# Patient Record
Sex: Female | Born: 1955 | Race: White | Hispanic: No | Marital: Married | Smoking: Never smoker
Health system: Southern US, Community
[De-identification: ages and names within clinical notes are randomized; demographics above are authoritative.]

## PROBLEM LIST (undated history)

## (undated) DIAGNOSIS — E039 Hypothyroidism, unspecified: Secondary | ICD-10-CM

## (undated) HISTORY — PX: BREAST BIOPSY: SHX20

## (undated) HISTORY — PX: CHOLECYSTECTOMY: SHX55

---

## 1999-11-01 ENCOUNTER — Other Ambulatory Visit: Admission: RE | Admit: 1999-11-01 | Discharge: 1999-11-01 | Payer: Self-pay | Admitting: Obstetrics and Gynecology

## 1999-11-02 ENCOUNTER — Encounter: Admission: RE | Admit: 1999-11-02 | Discharge: 1999-11-02 | Payer: Self-pay | Admitting: Obstetrics and Gynecology

## 1999-11-02 ENCOUNTER — Encounter: Payer: Self-pay | Admitting: Obstetrics and Gynecology

## 2000-02-14 ENCOUNTER — Encounter: Payer: Self-pay | Admitting: Neurology

## 2000-02-14 ENCOUNTER — Encounter: Admission: RE | Admit: 2000-02-14 | Discharge: 2000-02-14 | Payer: Self-pay | Admitting: Neurology

## 2001-08-08 ENCOUNTER — Encounter: Payer: Self-pay | Admitting: Obstetrics and Gynecology

## 2001-08-08 ENCOUNTER — Encounter: Admission: RE | Admit: 2001-08-08 | Discharge: 2001-08-08 | Payer: Self-pay | Admitting: Obstetrics and Gynecology

## 2001-08-14 ENCOUNTER — Other Ambulatory Visit: Admission: RE | Admit: 2001-08-14 | Discharge: 2001-08-14 | Payer: Self-pay | Admitting: Obstetrics and Gynecology

## 2003-01-16 ENCOUNTER — Encounter: Payer: Self-pay | Admitting: Obstetrics and Gynecology

## 2003-01-16 ENCOUNTER — Encounter: Admission: RE | Admit: 2003-01-16 | Discharge: 2003-01-16 | Payer: Self-pay | Admitting: Obstetrics and Gynecology

## 2003-02-10 ENCOUNTER — Other Ambulatory Visit: Admission: RE | Admit: 2003-02-10 | Discharge: 2003-02-10 | Payer: Self-pay | Admitting: Obstetrics and Gynecology

## 2003-02-24 ENCOUNTER — Ambulatory Visit (HOSPITAL_COMMUNITY): Admission: RE | Admit: 2003-02-24 | Discharge: 2003-02-24 | Payer: Self-pay | Admitting: Neurology

## 2003-02-24 ENCOUNTER — Encounter: Payer: Self-pay | Admitting: Neurology

## 2003-03-06 ENCOUNTER — Encounter: Admission: RE | Admit: 2003-03-06 | Discharge: 2003-06-04 | Payer: Self-pay | Admitting: Neurology

## 2004-08-10 ENCOUNTER — Encounter: Admission: RE | Admit: 2004-08-10 | Discharge: 2004-08-10 | Payer: Self-pay | Admitting: Obstetrics and Gynecology

## 2004-09-08 ENCOUNTER — Encounter: Admission: RE | Admit: 2004-09-08 | Discharge: 2004-09-08 | Payer: Self-pay | Admitting: Obstetrics and Gynecology

## 2004-09-20 ENCOUNTER — Encounter (HOSPITAL_COMMUNITY): Admission: RE | Admit: 2004-09-20 | Discharge: 2004-12-19 | Payer: Self-pay | Admitting: Geriatric Medicine

## 2004-09-23 ENCOUNTER — Encounter (INDEPENDENT_AMBULATORY_CARE_PROVIDER_SITE_OTHER): Payer: Self-pay | Admitting: *Deleted

## 2004-09-23 ENCOUNTER — Ambulatory Visit (HOSPITAL_COMMUNITY): Admission: RE | Admit: 2004-09-23 | Discharge: 2004-09-23 | Payer: Self-pay | Admitting: Geriatric Medicine

## 2005-04-27 ENCOUNTER — Encounter: Admission: RE | Admit: 2005-04-27 | Discharge: 2005-04-27 | Payer: Self-pay | Admitting: Geriatric Medicine

## 2005-07-07 ENCOUNTER — Ambulatory Visit (HOSPITAL_COMMUNITY): Admission: RE | Admit: 2005-07-07 | Discharge: 2005-07-07 | Payer: Self-pay | Admitting: Gastroenterology

## 2005-10-12 ENCOUNTER — Encounter: Admission: RE | Admit: 2005-10-12 | Discharge: 2005-10-12 | Payer: Self-pay | Admitting: Obstetrics and Gynecology

## 2006-01-23 ENCOUNTER — Encounter: Admission: RE | Admit: 2006-01-23 | Discharge: 2006-01-23 | Payer: Self-pay | Admitting: Geriatric Medicine

## 2006-03-07 IMAGING — US US SOFT TISSUE HEAD/NECK
1 series · 14 of 25 positions shown · non-contrast
Comparison: none

CLINICAL DATA: 48-year-old female, with enlarged thyroid. 
THYROID ULTRASOUND:

[Series 1: unknown · 0.07mm/px · 14 of 58 slices shown]
[im 1/58]
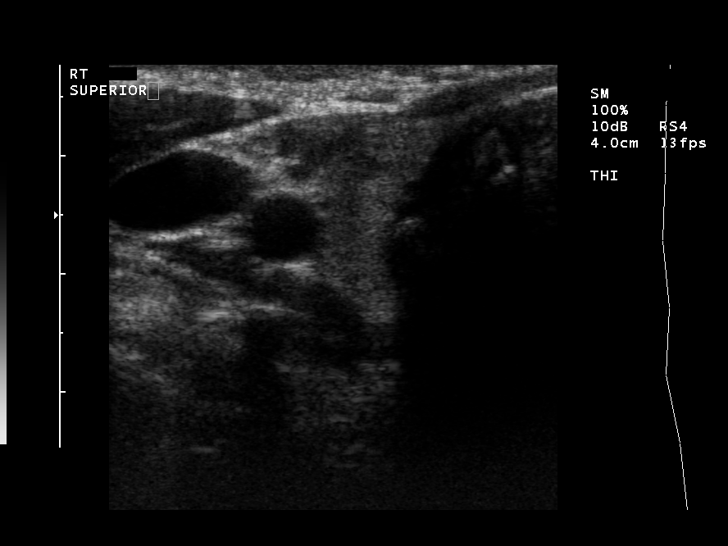
[im 5/58]
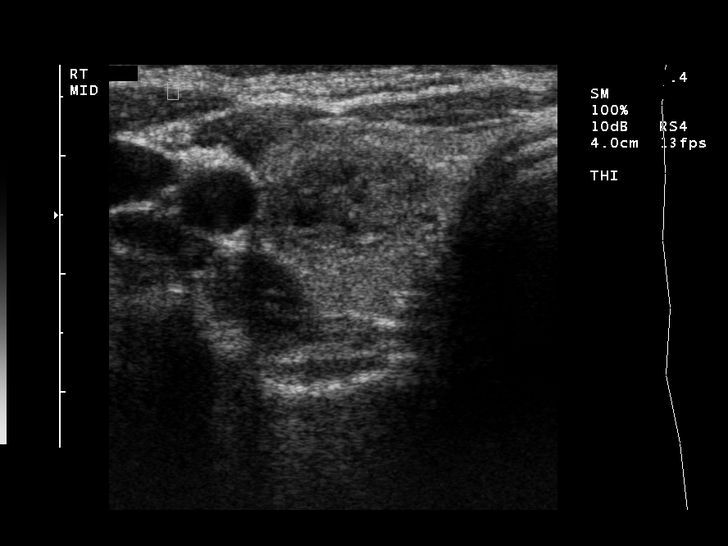
[im 10/58]
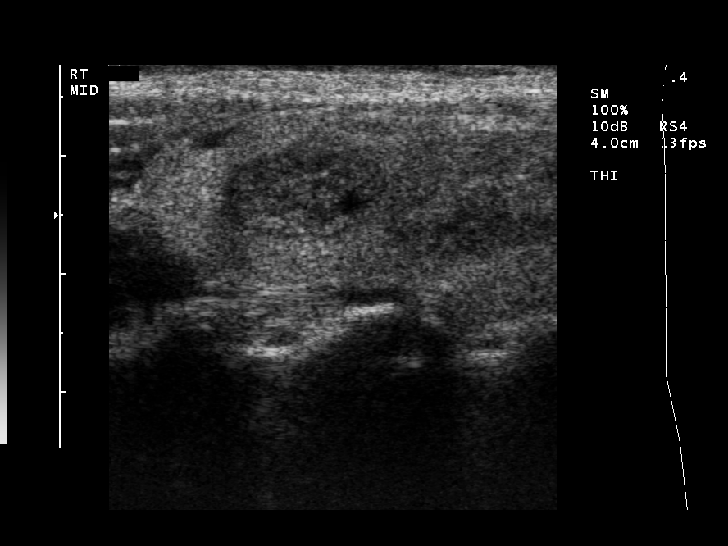
[im 15/58]
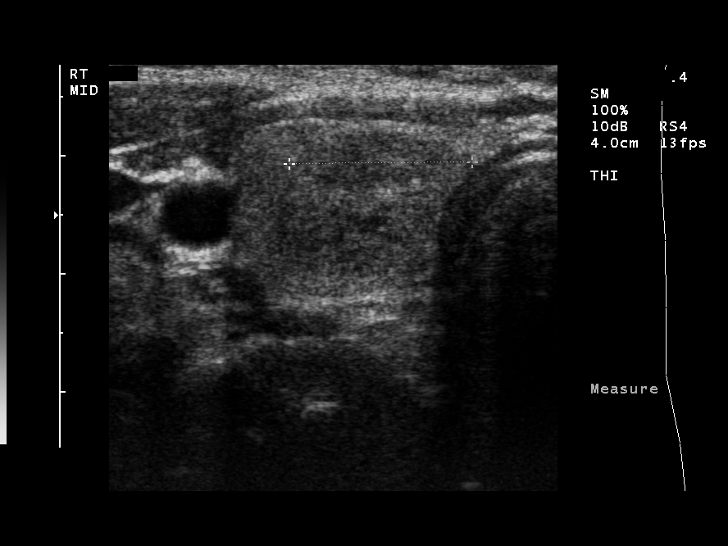
[im 20/58]
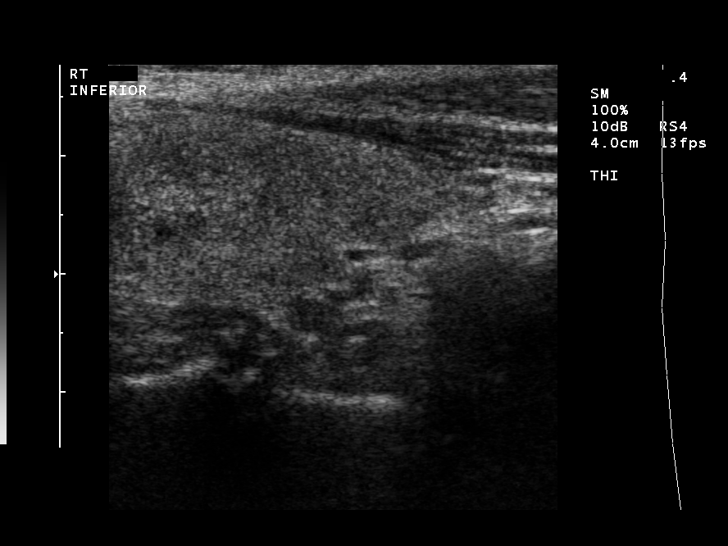
[im 22/58]
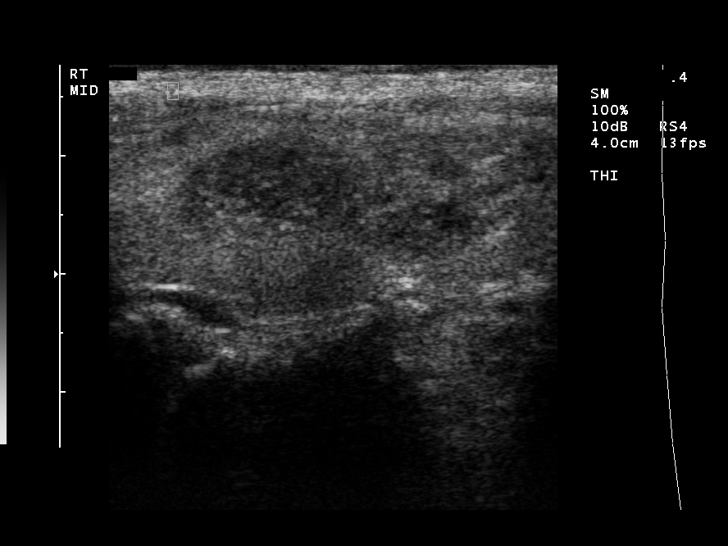
[im 27/58]
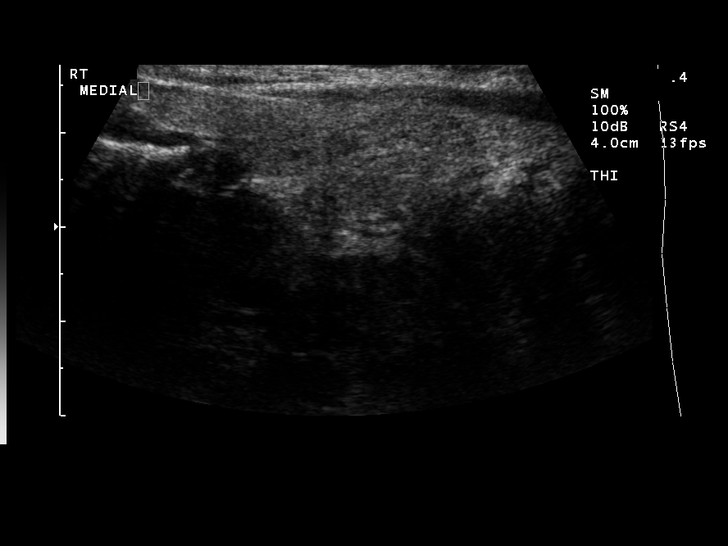
[im 31/58]
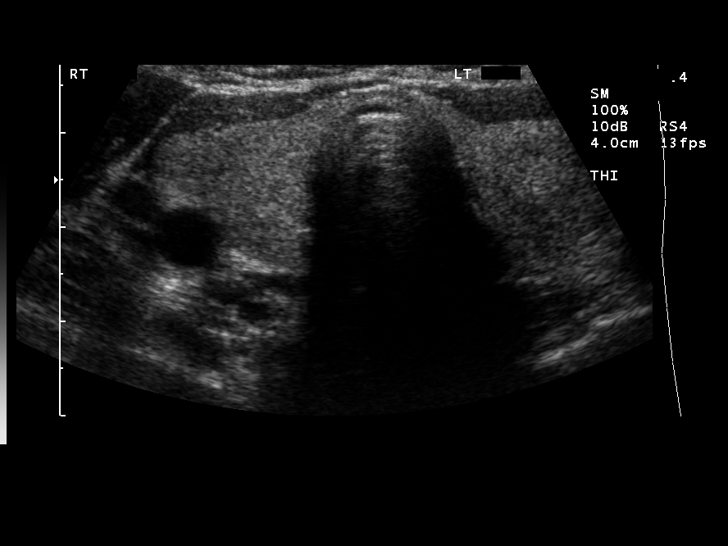
[im 36/58]
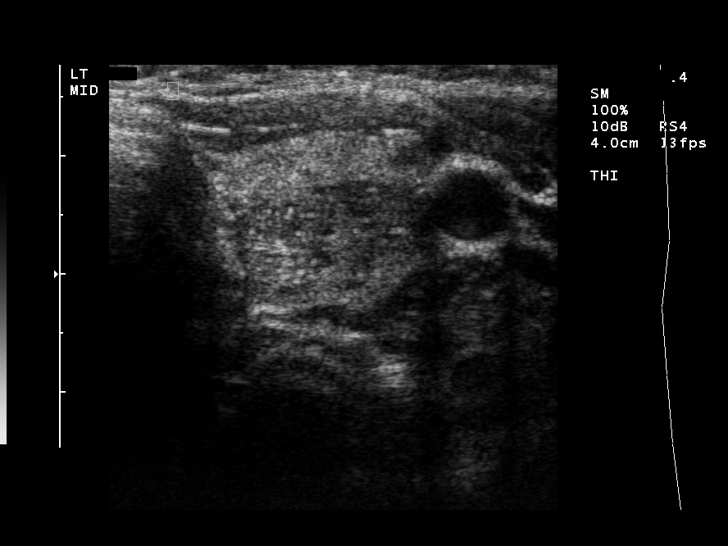
[im 39/58]
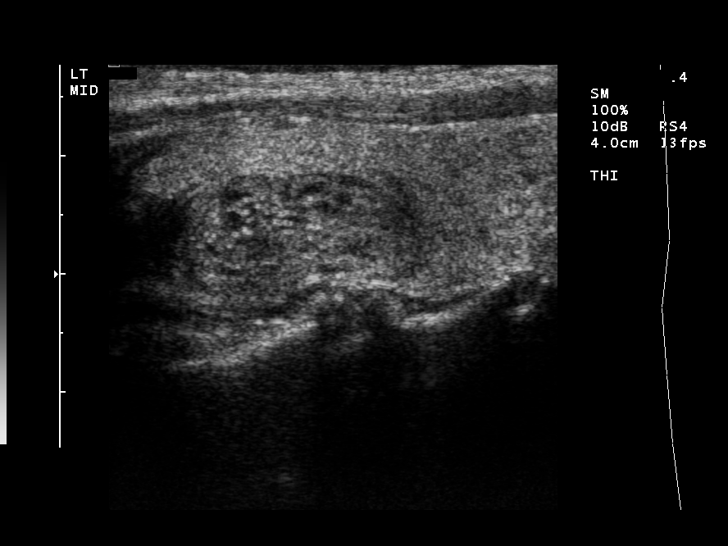
[im 43/58]
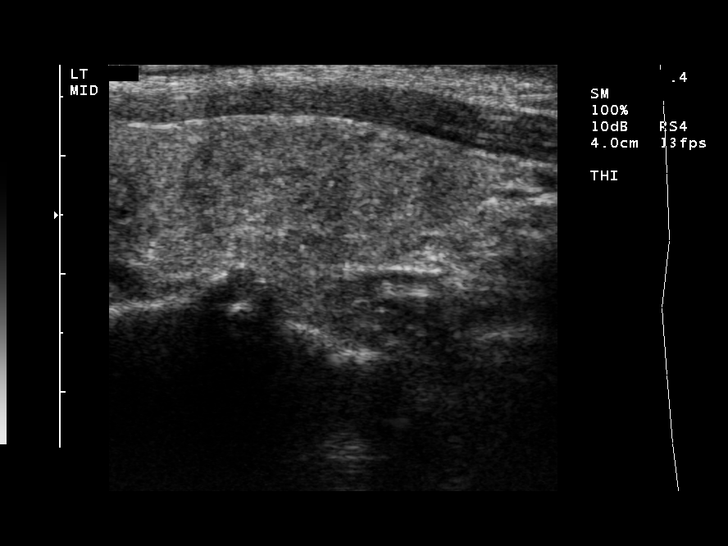
[im 48/58]
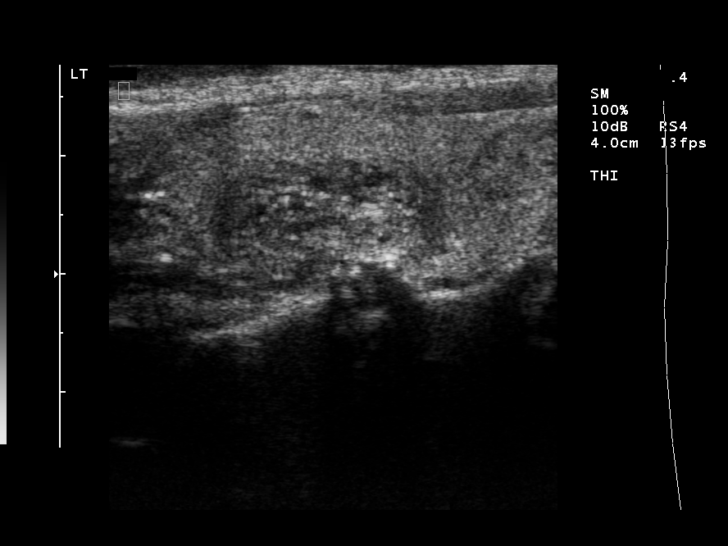
[im 53/58]
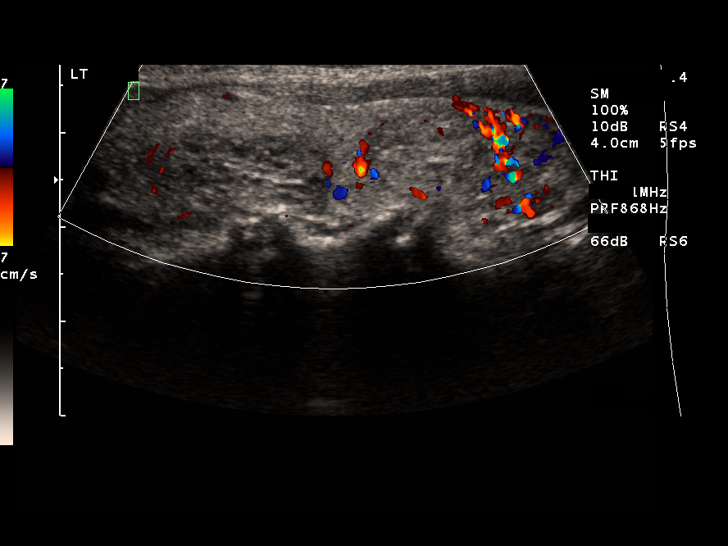
[im 58/58]
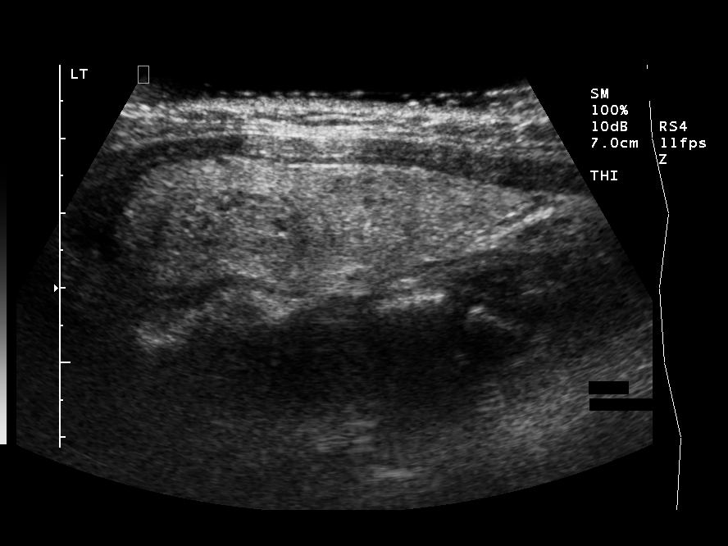

[14 of 25 positions shown; findings below may reference images not displayed]

FINDINGS: Thyroid echotexture is mildly heterogeneous with the right lobe measuring 5.7 x 1.5 x 1.8 cm and the left lobe measuring 5.8 x 1.6 x 1.9 cm.  There are several focal nodules identified.  These include a 1.3 x 1.3 cm nodule in the mid right thyroid, a 1.9 x 1.5 cm nodule in the right lower thyroid, a 2.1 x 1.6 cm nodule in the left midthyroid, and a 2.5 x 1.6 cm nodule in the lower left thyroid.  A few smaller nodules scattered within the thyroid gland are noted, less than 5 mm.  No definite calcifications or cystic areas associated with these nodules.  No other focal masses identified.
IMPRESSION: 1.  Scattered bilateral thyroid nodules.  Four are over 1.5 cm and consider interval followup, nuclear medicine study, or sampling. 
2.  Mildly heterogeneous thyroid echotexture.

## 2006-10-13 ENCOUNTER — Encounter: Admission: RE | Admit: 2006-10-13 | Discharge: 2006-10-13 | Payer: Self-pay | Admitting: Obstetrics and Gynecology

## 2007-10-17 ENCOUNTER — Encounter: Admission: RE | Admit: 2007-10-17 | Discharge: 2007-10-17 | Payer: Self-pay | Admitting: Obstetrics and Gynecology

## 2007-10-22 ENCOUNTER — Encounter: Admission: RE | Admit: 2007-10-22 | Discharge: 2007-10-22 | Payer: Self-pay | Admitting: Obstetrics and Gynecology

## 2007-11-16 ENCOUNTER — Encounter: Admission: RE | Admit: 2007-11-16 | Discharge: 2007-11-16 | Payer: Self-pay | Admitting: Obstetrics and Gynecology

## 2007-11-19 HISTORY — PX: BREAST BIOPSY: SHX20

## 2008-06-23 ENCOUNTER — Encounter: Admission: RE | Admit: 2008-06-23 | Discharge: 2008-06-23 | Payer: Self-pay | Admitting: Geriatric Medicine

## 2008-07-01 ENCOUNTER — Ambulatory Visit (HOSPITAL_COMMUNITY): Admission: RE | Admit: 2008-07-01 | Discharge: 2008-07-01 | Payer: Self-pay | Admitting: *Deleted

## 2008-07-08 ENCOUNTER — Ambulatory Visit (HOSPITAL_COMMUNITY): Admission: RE | Admit: 2008-07-08 | Discharge: 2008-07-08 | Payer: Self-pay | Admitting: Gastroenterology

## 2008-07-10 ENCOUNTER — Encounter (INDEPENDENT_AMBULATORY_CARE_PROVIDER_SITE_OTHER): Payer: Self-pay | Admitting: Gastroenterology

## 2008-07-10 ENCOUNTER — Ambulatory Visit (HOSPITAL_COMMUNITY): Admission: RE | Admit: 2008-07-10 | Discharge: 2008-07-10 | Payer: Self-pay | Admitting: Gastroenterology

## 2008-08-07 ENCOUNTER — Ambulatory Visit (HOSPITAL_COMMUNITY): Admission: RE | Admit: 2008-08-07 | Discharge: 2008-08-07 | Payer: Self-pay | Admitting: Surgery

## 2008-08-07 ENCOUNTER — Encounter (INDEPENDENT_AMBULATORY_CARE_PROVIDER_SITE_OTHER): Payer: Self-pay | Admitting: Surgery

## 2008-12-08 ENCOUNTER — Encounter: Admission: RE | Admit: 2008-12-08 | Discharge: 2008-12-08 | Payer: Self-pay | Admitting: Obstetrics and Gynecology

## 2009-12-21 ENCOUNTER — Encounter: Admission: RE | Admit: 2009-12-21 | Discharge: 2009-12-21 | Payer: Self-pay | Admitting: Obstetrics and Gynecology

## 2010-08-17 ENCOUNTER — Emergency Department (HOSPITAL_COMMUNITY): Admission: EM | Admit: 2010-08-17 | Discharge: 2010-08-17 | Payer: Self-pay | Admitting: Emergency Medicine

## 2010-09-22 ENCOUNTER — Encounter
Admission: RE | Admit: 2010-09-22 | Discharge: 2010-09-22 | Payer: Self-pay | Source: Home / Self Care | Attending: Geriatric Medicine | Admitting: Geriatric Medicine

## 2010-12-17 ENCOUNTER — Other Ambulatory Visit: Payer: Self-pay | Admitting: Obstetrics and Gynecology

## 2010-12-17 DIAGNOSIS — Z1231 Encounter for screening mammogram for malignant neoplasm of breast: Secondary | ICD-10-CM

## 2011-01-05 ENCOUNTER — Ambulatory Visit
Admission: RE | Admit: 2011-01-05 | Discharge: 2011-01-05 | Disposition: A | Discharge status: Home / Self Care | Payer: 59 | Source: Ambulatory Visit | Attending: Obstetrics and Gynecology | Admitting: Obstetrics and Gynecology

## 2011-01-05 DIAGNOSIS — Z1231 Encounter for screening mammogram for malignant neoplasm of breast: Secondary | ICD-10-CM

## 2011-01-28 ENCOUNTER — Other Ambulatory Visit: Payer: Self-pay | Admitting: Obstetrics and Gynecology

## 2011-02-22 NOTE — Op Note (Signed)
[name], [name]           ACCOUNT NO.:  [id number]   MEDICAL RECORD NO.:  [id number]          PATIENT TYPE:  AMB   LOCATION:  SDS                          FACILITY:  [facility]   PHYSICIAN:  Currie Paris, M.D.DATE OF BIRTH:  [birth year]   DATE OF PROCEDURE:  08/07/2008  DATE OF DISCHARGE:  08/07/2008                               OPERATIVE REPORT   PREOPERATIVE DIAGNOSIS:  Gallbladder polyp.   POSTOPERATIVE DIAGNOSIS:  Gallbladder polyp.   OPERATION:  Laparoscopic cholecystectomy with operative cholangiogram.   SURGEON:  Currie Paris, MD   ASSISTANT:  Almond Lint, MD   ANESTHESIA:  General.   CLINICAL HISTORY:  This is a 55 year old lady who had had some GI  disturbance and workup was done.  As part of the workup, an ultrasound  of the gallbladder was done.  No stones were demonstrated, but what  appeared to be a polyp in the dome of the gallbladder was noted.  It was  over 1 cm.  After discussion with the patient, we elected to proceed to  cholecystectomy.   DESCRIPTION OF PROCEDURE:  The patient was seen in the holding area and  she had no further questions.  We confirmed cholecystectomy as the  planned procedure.   The patient was taken to the operating room and after satisfactory  general endotracheal anesthesia had been obtained, the abdomen was  prepped and draped.  The time-out was done.   I used 0.25% plain Marcaine for each incision.  The umbilical incision  was made, the fascia opened, and the peritoneal cavity entered.  A purse-  string was placed, the Hasson introduced, and the abdomen insufflated to  15.   With the patient placed in reverse Trendelenburg and tilted to the left,  a 10-11 trocar was placed in the epigastrium and two 5 mm laterally, all  under direct vision.   The gallbladder was noted to be robin's egg blue.  There were few  omental adhesions near the triangle of Calot.  No other abnormalities  were noted with the camera on  looking through the abdomen.   The gallbladder was retracted over the liver.  The adhesions were taken  down.  The peritoneum was opened on the triangle of CLO both anteriorly  and posteriorly, and a nice window dissected out.  I was able to get a  segment of cystic duct, a segment of cystic artery, and a window behind  the cystic artery all opened up, so I had a clear identification of the  anatomy.   A single clip was placed on the cystic artery and one of the cystic duct  with the junction of the gallbladder.  The cystic duct was opened and a  Cook catheter introduced and operative angiography was done.  We did 2  runs.  We saw good filling of the duodenum and what appeared to be a  very long cystic duct draping across the common duct.  We got filling of  the hepatic radicals on the second injection.   The catheter was removed and 3 clips placed on the stay side of the  cystic  duct.  It was divided.  Two additional clips were placed on the  cystic artery and it was divided leaving 2 clips on the stay side.  The  gallbladder was then removed from below to above with coagulation  current of the cautery.  A small vessel was clipped about a third of the  way up the gallbladder bed.   I took care to not spill bile and we were able to keep the gallbladder  intact.  We took special care staying away from the gallbladder as we  got to the dome which was where the polyp appeared to be on preoperative  studies.   The gallbladder was placed in a bag.  I irrigated and made sure  everything was dry.  I then brought the gallbladder out through the  epigastric port.  We reinsufflated and made sure everything remained dry  and everything appeared okay.  The lateral trocars were removed.  The  abdomen was deflated through the epigastric port.  The umbilical port  was closed with a purse-string.  Skin was closed with 4-0 Monocryl  subcuticular plus Dermabond.   The patient tolerated the  procedure well and there were no  complications.  All counts were correct.      Currie Paris, M.D.  Electronically Signed     CJS/MEDQ  D:  08/07/2008  T:  08/08/2008  Job:  [id number]   cc:   Hal T. Stoneking, M.D.  James L. Malon Kindle., M.D.

## 2011-02-22 NOTE — Op Note (Signed)
[name], [name]           ACCOUNT NO.:  [id number]   MEDICAL RECORD NO.:  [id number]          PATIENT TYPE:  AMB   LOCATION:  ENDO                         FACILITY:  [facility]   PHYSICIAN:  James L. Malon Kindle., M.D.DATE OF BIRTH:  10-27-1955   DATE OF PROCEDURE:  07/10/2008  DATE OF DISCHARGE:                               OPERATIVE REPORT   PROCEDURE:  Esophagogastroduodenoscopy with biopsy.   MEDICATIONS:  Cetacaine spray, fentanyl 75 mcg, Versed 10 mg IV.   INDICATIONS FOR PROCEDURE:  Right upper quadrant pain, dyspepsia with an  abnormal gallbladder on ultrasound, MRI.   DESCRIPTION OF PROCEDURE:  The procedure has been explained to the  patient and consent obtained.  In the lateral decubitus position the  Pentax upper scope is inserted and advanced.  The stomach was entered.  The duodenum including the bulb and second portion were seen well and  were normal.  There was a biopsy for celiac disease.  The scope was  withdrawn back into the stomach.  The pyloric channel and the antrum  were basically normal.  There were very prominent folds in the proximal  stomach.  These were not abnormal looking and could have been  hyperplasia due to chronic proton pump inhibitors but were biopsied,  somewhat friable.  The fundus and cardia were seen well on the retroflex  view.  The scope was withdrawn.  Again the distal esophagus was slightly  red.  There was a small hiatal hernia.  The patient tolerated the  procedure well.   ASSESSMENT:  1. Probable esophageal reflux.  2. 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   PLAN:  Will check path results, keep her on Prilosec and give antireflux  information.  She is due to see Dr. Jamey Ripa in the near future regarding  a possible cholecystectomy.           ______________________________  Llana Aliment Malon Kindle., M.D.     Waldron Session  D:  07/10/2008  T:  07/10/2008  Job:  [id number]   cc:    Currie Paris, M.D.  [address]  [place]  [place] [address]   Hal T. Stoneking, M.D.  Fax: [phone]

## 2011-02-25 NOTE — Op Note (Signed)
Courtney, Shah           ACCOUNT NO.:  [id number]   MEDICAL RECORD NO.:  [id number]          PATIENT TYPE:  AMB   LOCATION:  ENDO                         FACILITY:  [facility]   PHYSICIAN:  James L. Malon Kindle., M.D.DATE OF BIRTH:  1956/07/16   DATE OF PROCEDURE:  07/06/2005  DATE OF DISCHARGE:                                 OPERATIVE REPORT   PROCEDURE:  Colonoscopy.   MEDICATIONS:  Fentanyl 100 mcg, Versed 10 mg.   ENDOSCOPE:  Pediatric adjustable scope.   INDICATIONS:  Colon cancer screening.   DESCRIPTION OF PROCEDURE:  The procedure explained to the patient and  consent obtained.  In the left lateral decubitus position, the Olympus scope  was inserted and advanced.  Prep adequate, some solid material, in  particular at the vicinity of the splenic flexure where it pooled over some  seeds and __________.  We had to change the patient's position several  times, and we were able to see this well.  The cecum reached, ileocecal  valve and appendiceal orifice seen, scope withdrawn.  Normal mucosa  throughout.  No polyps seen, no significant diverticulosis.  The rectum was  free of lesions.  The scope withdrawn.  The patient tolerated the procedure  well.   ASSESSMENT:  Normal screening colonoscopy, 376.51.   PLAN:  Will recommend yearly Hemoccults and repeat the procedure in 10  years.           ______________________________  Llana Aliment Malon Kindle., M.D.     [name]  D:  07/07/2005  T:  07/07/2005  Job:  [id number]   cc:   Hal T. Stoneking, M.D.  Fax: [phone]   Ladona Horns. Mariel Sleet, MD  Fax: [phone]

## 2011-07-12 LAB — DIFFERENTIAL
Basophils Relative: 0
Eosinophils Absolute: 0.4
Eosinophils Relative: 9 — ABNORMAL HIGH
Lymphs Abs: 1.3

## 2011-07-12 LAB — COMPREHENSIVE METABOLIC PANEL
ALT: 35
AST: 33
Alkaline Phosphatase: 65
CO2: 27
Calcium: 9.3
Chloride: 106
GFR calc Af Amer: >60
GFR calc non Af Amer: >60
Potassium: 4
Sodium: 140

## 2011-07-12 LAB — LIPID PANEL
Cholesterol: 283 — ABNORMAL HIGH
Total CHOL/HDL Ratio: 4.4
VLDL: 19

## 2011-07-12 LAB — URINALYSIS, ROUTINE W REFLEX MICROSCOPIC
Nitrite: NEGATIVE
Specific Gravity, Urine: 1.028
Urobilinogen, UA: 0.2

## 2011-07-12 LAB — CBC
Hemoglobin: 14.9
MCHC: 33.5
RBC: 4.94
WBC: 4.6

## 2011-09-26 ENCOUNTER — Other Ambulatory Visit: Payer: Self-pay | Admitting: Geriatric Medicine

## 2011-09-26 DIAGNOSIS — E041 Nontoxic single thyroid nodule: Secondary | ICD-10-CM

## 2011-09-27 ENCOUNTER — Ambulatory Visit
Admission: RE | Admit: 2011-09-27 | Discharge: 2011-09-27 | Disposition: A | Discharge status: Home / Self Care | Payer: 59 | Source: Ambulatory Visit | Attending: Geriatric Medicine | Admitting: Geriatric Medicine

## 2011-09-27 DIAGNOSIS — E041 Nontoxic single thyroid nodule: Secondary | ICD-10-CM

## 2011-12-20 ENCOUNTER — Other Ambulatory Visit: Payer: Self-pay | Admitting: Obstetrics and Gynecology

## 2011-12-20 DIAGNOSIS — Z1231 Encounter for screening mammogram for malignant neoplasm of breast: Secondary | ICD-10-CM

## 2012-01-10 ENCOUNTER — Ambulatory Visit
Admission: RE | Admit: 2012-01-10 | Discharge: 2012-01-10 | Disposition: A | Discharge status: Home / Self Care | Payer: 59 | Source: Ambulatory Visit | Attending: Obstetrics and Gynecology | Admitting: Obstetrics and Gynecology

## 2012-01-10 DIAGNOSIS — Z1231 Encounter for screening mammogram for malignant neoplasm of breast: Secondary | ICD-10-CM

## 2012-02-17 ENCOUNTER — Other Ambulatory Visit: Payer: Self-pay | Admitting: Dermatology

## 2012-08-15 ENCOUNTER — Other Ambulatory Visit: Payer: Self-pay | Admitting: Neurology

## 2012-08-15 ENCOUNTER — Ambulatory Visit
Admission: RE | Admit: 2012-08-15 | Discharge: 2012-08-15 | Disposition: A | Discharge status: Home / Self Care | Payer: 59 | Source: Ambulatory Visit | Attending: Neurology | Admitting: Neurology

## 2012-08-15 DIAGNOSIS — G518 Other disorders of facial nerve: Secondary | ICD-10-CM

## 2012-08-15 DIAGNOSIS — M549 Dorsalgia, unspecified: Secondary | ICD-10-CM

## 2012-08-15 DIAGNOSIS — M542 Cervicalgia: Secondary | ICD-10-CM

## 2012-08-15 DIAGNOSIS — S0990XA Unspecified injury of head, initial encounter: Secondary | ICD-10-CM

## 2012-09-26 ENCOUNTER — Ambulatory Visit
Admission: RE | Admit: 2012-09-26 | Discharge: 2012-09-26 | Disposition: A | Discharge status: Home / Self Care | Payer: 59 | Source: Ambulatory Visit | Attending: Geriatric Medicine | Admitting: Geriatric Medicine

## 2012-09-26 ENCOUNTER — Other Ambulatory Visit: Payer: Self-pay | Admitting: Geriatric Medicine

## 2012-09-26 DIAGNOSIS — E041 Nontoxic single thyroid nodule: Secondary | ICD-10-CM

## 2012-12-04 ENCOUNTER — Other Ambulatory Visit: Payer: Self-pay | Admitting: Obstetrics and Gynecology

## 2012-12-04 DIAGNOSIS — Z1231 Encounter for screening mammogram for malignant neoplasm of breast: Secondary | ICD-10-CM

## 2013-01-11 ENCOUNTER — Ambulatory Visit
Admission: RE | Admit: 2013-01-11 | Discharge: 2013-01-11 | Disposition: A | Discharge status: Home / Self Care | Payer: 59 | Source: Ambulatory Visit | Attending: Obstetrics and Gynecology | Admitting: Obstetrics and Gynecology

## 2013-01-11 DIAGNOSIS — Z1231 Encounter for screening mammogram for malignant neoplasm of breast: Secondary | ICD-10-CM

## 2013-01-31 ENCOUNTER — Other Ambulatory Visit: Payer: Self-pay | Admitting: Obstetrics and Gynecology

## 2013-09-27 ENCOUNTER — Ambulatory Visit
Admission: RE | Admit: 2013-09-27 | Discharge: 2013-09-27 | Disposition: A | Discharge status: Home / Self Care | Payer: 59 | Source: Ambulatory Visit | Attending: Geriatric Medicine | Admitting: Geriatric Medicine

## 2013-09-27 ENCOUNTER — Other Ambulatory Visit: Payer: Self-pay | Admitting: Geriatric Medicine

## 2013-09-27 DIAGNOSIS — E049 Nontoxic goiter, unspecified: Secondary | ICD-10-CM

## 2013-12-12 ENCOUNTER — Other Ambulatory Visit: Payer: Self-pay | Admitting: Dermatology

## 2014-01-14 ENCOUNTER — Other Ambulatory Visit: Payer: Self-pay

## 2014-01-14 DIAGNOSIS — Z1231 Encounter for screening mammogram for malignant neoplasm of breast: Secondary | ICD-10-CM

## 2014-02-04 ENCOUNTER — Ambulatory Visit: Payer: 59

## 2014-02-04 ENCOUNTER — Ambulatory Visit
Admission: RE | Admit: 2014-02-04 | Discharge: 2014-02-04 | Disposition: A | Discharge status: Home / Self Care | Payer: Managed Care, Other (non HMO) | Source: Ambulatory Visit

## 2014-02-04 ENCOUNTER — Encounter (INDEPENDENT_AMBULATORY_CARE_PROVIDER_SITE_OTHER): Payer: Self-pay

## 2014-02-04 DIAGNOSIS — Z1231 Encounter for screening mammogram for malignant neoplasm of breast: Secondary | ICD-10-CM

## 2014-02-05 ENCOUNTER — Other Ambulatory Visit: Payer: Self-pay | Admitting: Obstetrics and Gynecology

## 2014-07-31 ENCOUNTER — Other Ambulatory Visit: Payer: Self-pay | Admitting: Dermatology

## 2014-09-29 ENCOUNTER — Other Ambulatory Visit: Payer: Self-pay | Admitting: Geriatric Medicine

## 2014-09-29 DIAGNOSIS — E042 Nontoxic multinodular goiter: Secondary | ICD-10-CM

## 2014-10-13 ENCOUNTER — Ambulatory Visit
Admission: RE | Admit: 2014-10-13 | Discharge: 2014-10-13 | Disposition: A | Discharge status: Home / Self Care | Payer: Managed Care, Other (non HMO) | Source: Ambulatory Visit | Attending: Geriatric Medicine | Admitting: Geriatric Medicine

## 2014-10-13 DIAGNOSIS — E042 Nontoxic multinodular goiter: Secondary | ICD-10-CM

## 2014-12-31 ENCOUNTER — Other Ambulatory Visit: Payer: Self-pay

## 2014-12-31 DIAGNOSIS — Z1239 Encounter for other screening for malignant neoplasm of breast: Secondary | ICD-10-CM

## 2015-02-10 ENCOUNTER — Encounter (INDEPENDENT_AMBULATORY_CARE_PROVIDER_SITE_OTHER): Payer: Self-pay

## 2015-02-10 ENCOUNTER — Ambulatory Visit
Admission: RE | Admit: 2015-02-10 | Discharge: 2015-02-10 | Disposition: A | Discharge status: Home / Self Care | Payer: Commercial Indemnity | Source: Ambulatory Visit

## 2015-02-10 DIAGNOSIS — Z1239 Encounter for other screening for malignant neoplasm of breast: Secondary | ICD-10-CM

## 2015-03-10 ENCOUNTER — Other Ambulatory Visit: Payer: Self-pay | Admitting: Obstetrics and Gynecology

## 2015-03-11 LAB — CYTOLOGY - PAP

## 2015-08-20 ENCOUNTER — Other Ambulatory Visit: Payer: Self-pay | Admitting: Obstetrics and Gynecology

## 2015-10-13 ENCOUNTER — Other Ambulatory Visit: Payer: Self-pay | Admitting: Geriatric Medicine

## 2015-10-13 DIAGNOSIS — E042 Nontoxic multinodular goiter: Secondary | ICD-10-CM

## 2015-10-20 ENCOUNTER — Other Ambulatory Visit: Payer: Self-pay | Admitting: Obstetrics and Gynecology

## 2015-10-26 ENCOUNTER — Ambulatory Visit
Admission: RE | Admit: 2015-10-26 | Discharge: 2015-10-26 | Disposition: A | Discharge status: Home / Self Care | Payer: Commercial Indemnity | Source: Ambulatory Visit | Attending: Geriatric Medicine | Admitting: Geriatric Medicine

## 2015-10-26 DIAGNOSIS — E042 Nontoxic multinodular goiter: Secondary | ICD-10-CM

## 2016-01-27 ENCOUNTER — Other Ambulatory Visit: Payer: Self-pay

## 2016-01-27 DIAGNOSIS — Z1231 Encounter for screening mammogram for malignant neoplasm of breast: Secondary | ICD-10-CM

## 2016-02-11 ENCOUNTER — Other Ambulatory Visit: Payer: Self-pay | Admitting: Gastroenterology

## 2016-02-16 ENCOUNTER — Ambulatory Visit
Admission: RE | Admit: 2016-02-16 | Discharge: 2016-02-16 | Disposition: A | Discharge status: Home / Self Care | Payer: Managed Care, Other (non HMO) | Source: Ambulatory Visit

## 2016-02-16 DIAGNOSIS — Z1231 Encounter for screening mammogram for malignant neoplasm of breast: Secondary | ICD-10-CM

## 2016-10-19 ENCOUNTER — Other Ambulatory Visit: Payer: Self-pay | Admitting: Geriatric Medicine

## 2016-10-19 DIAGNOSIS — E042 Nontoxic multinodular goiter: Secondary | ICD-10-CM

## 2016-10-24 ENCOUNTER — Ambulatory Visit
Admission: RE | Admit: 2016-10-24 | Discharge: 2016-10-24 | Disposition: A | Discharge status: Home / Self Care | Payer: Managed Care, Other (non HMO) | Source: Ambulatory Visit | Attending: Geriatric Medicine | Admitting: Geriatric Medicine

## 2016-10-24 DIAGNOSIS — E042 Nontoxic multinodular goiter: Secondary | ICD-10-CM

## 2016-12-12 ENCOUNTER — Other Ambulatory Visit: Payer: Self-pay | Admitting: Obstetrics and Gynecology

## 2016-12-13 LAB — CYTOLOGY - PAP

## 2016-12-27 ENCOUNTER — Other Ambulatory Visit: Payer: Self-pay | Admitting: Geriatric Medicine

## 2016-12-27 DIAGNOSIS — M5441 Lumbago with sciatica, right side: Secondary | ICD-10-CM

## 2017-01-01 ENCOUNTER — Other Ambulatory Visit: Payer: Managed Care, Other (non HMO)

## 2017-05-04 ENCOUNTER — Other Ambulatory Visit: Payer: Self-pay | Admitting: Obstetrics and Gynecology

## 2017-05-04 DIAGNOSIS — Z1231 Encounter for screening mammogram for malignant neoplasm of breast: Secondary | ICD-10-CM

## 2017-05-29 ENCOUNTER — Ambulatory Visit
Admission: RE | Admit: 2017-05-29 | Discharge: 2017-05-29 | Disposition: A | Discharge status: Home / Self Care | Payer: Managed Care, Other (non HMO) | Source: Ambulatory Visit | Attending: Obstetrics and Gynecology | Admitting: Obstetrics and Gynecology

## 2017-05-29 DIAGNOSIS — Z1231 Encounter for screening mammogram for malignant neoplasm of breast: Secondary | ICD-10-CM

## 2017-10-25 ENCOUNTER — Other Ambulatory Visit: Payer: Self-pay | Admitting: Geriatric Medicine

## 2017-10-25 DIAGNOSIS — E042 Nontoxic multinodular goiter: Secondary | ICD-10-CM

## 2017-10-31 ENCOUNTER — Ambulatory Visit
Admission: RE | Admit: 2017-10-31 | Discharge: 2017-10-31 | Disposition: A | Discharge status: Home / Self Care | Payer: Managed Care, Other (non HMO) | Source: Ambulatory Visit | Attending: Geriatric Medicine | Admitting: Geriatric Medicine

## 2017-10-31 DIAGNOSIS — E042 Nontoxic multinodular goiter: Secondary | ICD-10-CM

## 2017-11-06 ENCOUNTER — Ambulatory Visit
Admission: RE | Admit: 2017-11-06 | Discharge: 2017-11-06 | Disposition: A | Discharge status: Home / Self Care | Payer: Managed Care, Other (non HMO) | Source: Ambulatory Visit | Attending: Geriatric Medicine | Admitting: Geriatric Medicine

## 2018-01-27 DIAGNOSIS — Z79890 Hormone replacement therapy (postmenopausal): Secondary | ICD-10-CM | POA: Diagnosis not present

## 2018-01-27 DIAGNOSIS — Z79899 Other long term (current) drug therapy: Secondary | ICD-10-CM | POA: Diagnosis not present

## 2018-01-27 DIAGNOSIS — E039 Hypothyroidism, unspecified: Secondary | ICD-10-CM | POA: Diagnosis not present

## 2018-01-27 DIAGNOSIS — R1310 Dysphagia, unspecified: Secondary | ICD-10-CM | POA: Diagnosis not present

## 2018-01-27 DIAGNOSIS — J029 Acute pharyngitis, unspecified: Secondary | ICD-10-CM | POA: Diagnosis not present

## 2018-01-27 DIAGNOSIS — H9202 Otalgia, left ear: Secondary | ICD-10-CM | POA: Diagnosis not present

## 2018-01-27 DIAGNOSIS — L539 Erythematous condition, unspecified: Secondary | ICD-10-CM | POA: Diagnosis not present

## 2018-01-29 ENCOUNTER — Other Ambulatory Visit: Payer: Self-pay | Admitting: Geriatric Medicine

## 2018-01-29 DIAGNOSIS — K651 Peritoneal abscess: Secondary | ICD-10-CM

## 2018-01-29 DIAGNOSIS — M542 Cervicalgia: Secondary | ICD-10-CM

## 2018-01-29 DIAGNOSIS — J36 Peritonsillar abscess: Secondary | ICD-10-CM | POA: Diagnosis not present

## 2018-01-30 ENCOUNTER — Ambulatory Visit
Admission: RE | Admit: 2018-01-30 | Discharge: 2018-01-30 | Disposition: A | Discharge status: Home / Self Care | Payer: Managed Care, Other (non HMO) | Source: Ambulatory Visit | Attending: Geriatric Medicine | Admitting: Geriatric Medicine

## 2018-01-30 DIAGNOSIS — J36 Peritonsillar abscess: Secondary | ICD-10-CM

## 2018-01-30 DIAGNOSIS — R221 Localized swelling, mass and lump, neck: Secondary | ICD-10-CM | POA: Diagnosis not present

## 2018-01-30 DIAGNOSIS — M542 Cervicalgia: Secondary | ICD-10-CM

## 2018-05-08 DIAGNOSIS — Z124 Encounter for screening for malignant neoplasm of cervix: Secondary | ICD-10-CM | POA: Diagnosis not present

## 2018-05-08 DIAGNOSIS — E039 Hypothyroidism, unspecified: Secondary | ICD-10-CM | POA: Diagnosis not present

## 2018-05-08 DIAGNOSIS — Z01419 Encounter for gynecological examination (general) (routine) without abnormal findings: Secondary | ICD-10-CM | POA: Diagnosis not present

## 2018-05-08 DIAGNOSIS — Z6824 Body mass index (BMI) 24.0-24.9, adult: Secondary | ICD-10-CM | POA: Diagnosis not present

## 2018-06-05 ENCOUNTER — Other Ambulatory Visit: Payer: Self-pay | Admitting: Obstetrics and Gynecology

## 2018-06-05 DIAGNOSIS — Z1231 Encounter for screening mammogram for malignant neoplasm of breast: Secondary | ICD-10-CM

## 2018-06-07 DIAGNOSIS — D225 Melanocytic nevi of trunk: Secondary | ICD-10-CM | POA: Diagnosis not present

## 2018-06-07 DIAGNOSIS — Z85828 Personal history of other malignant neoplasm of skin: Secondary | ICD-10-CM | POA: Diagnosis not present

## 2018-06-07 DIAGNOSIS — L570 Actinic keratosis: Secondary | ICD-10-CM | POA: Diagnosis not present

## 2018-07-10 ENCOUNTER — Ambulatory Visit
Admission: RE | Admit: 2018-07-10 | Discharge: 2018-07-10 | Disposition: A | Discharge status: Home / Self Care | Payer: BLUE CROSS/BLUE SHIELD | Source: Ambulatory Visit | Attending: Obstetrics and Gynecology | Admitting: Obstetrics and Gynecology

## 2018-07-10 DIAGNOSIS — Z1231 Encounter for screening mammogram for malignant neoplasm of breast: Secondary | ICD-10-CM | POA: Diagnosis not present

## 2018-07-18 DIAGNOSIS — Z23 Encounter for immunization: Secondary | ICD-10-CM | POA: Diagnosis not present

## 2018-11-07 DIAGNOSIS — E7800 Pure hypercholesterolemia, unspecified: Secondary | ICD-10-CM | POA: Diagnosis not present

## 2018-11-07 DIAGNOSIS — Z23 Encounter for immunization: Secondary | ICD-10-CM | POA: Diagnosis not present

## 2018-11-07 DIAGNOSIS — Z79899 Other long term (current) drug therapy: Secondary | ICD-10-CM | POA: Diagnosis not present

## 2018-11-07 DIAGNOSIS — E039 Hypothyroidism, unspecified: Secondary | ICD-10-CM | POA: Diagnosis not present

## 2018-11-07 DIAGNOSIS — Z0000 Encounter for general adult medical examination without abnormal findings: Secondary | ICD-10-CM | POA: Diagnosis not present

## 2019-01-16 DIAGNOSIS — L438 Other lichen planus: Secondary | ICD-10-CM | POA: Diagnosis not present

## 2019-02-25 DIAGNOSIS — S93402A Sprain of unspecified ligament of left ankle, initial encounter: Secondary | ICD-10-CM | POA: Diagnosis not present

## 2019-02-25 DIAGNOSIS — M25572 Pain in left ankle and joints of left foot: Secondary | ICD-10-CM | POA: Diagnosis not present

## 2019-06-10 ENCOUNTER — Other Ambulatory Visit: Payer: Self-pay | Admitting: Obstetrics and Gynecology

## 2019-06-10 DIAGNOSIS — Z1231 Encounter for screening mammogram for malignant neoplasm of breast: Secondary | ICD-10-CM

## 2019-07-11 DIAGNOSIS — D1801 Hemangioma of skin and subcutaneous tissue: Secondary | ICD-10-CM | POA: Diagnosis not present

## 2019-07-11 DIAGNOSIS — L814 Other melanin hyperpigmentation: Secondary | ICD-10-CM | POA: Diagnosis not present

## 2019-07-11 DIAGNOSIS — L570 Actinic keratosis: Secondary | ICD-10-CM | POA: Diagnosis not present

## 2019-07-11 DIAGNOSIS — D225 Melanocytic nevi of trunk: Secondary | ICD-10-CM | POA: Diagnosis not present

## 2019-07-11 DIAGNOSIS — Z85828 Personal history of other malignant neoplasm of skin: Secondary | ICD-10-CM | POA: Diagnosis not present

## 2019-07-30 ENCOUNTER — Other Ambulatory Visit: Payer: Self-pay

## 2019-07-30 ENCOUNTER — Ambulatory Visit
Admission: RE | Admit: 2019-07-30 | Discharge: 2019-07-30 | Disposition: A | Discharge status: Home / Self Care | Payer: BLUE CROSS/BLUE SHIELD | Source: Ambulatory Visit | Attending: Obstetrics and Gynecology | Admitting: Obstetrics and Gynecology

## 2019-07-30 DIAGNOSIS — Z1231 Encounter for screening mammogram for malignant neoplasm of breast: Secondary | ICD-10-CM | POA: Diagnosis not present

## 2019-08-07 DIAGNOSIS — Z23 Encounter for immunization: Secondary | ICD-10-CM | POA: Diagnosis not present

## 2019-09-17 DIAGNOSIS — N950 Postmenopausal bleeding: Secondary | ICD-10-CM | POA: Diagnosis not present

## 2019-09-18 DIAGNOSIS — H2513 Age-related nuclear cataract, bilateral: Secondary | ICD-10-CM | POA: Diagnosis not present

## 2019-09-18 DIAGNOSIS — H31091 Other chorioretinal scars, right eye: Secondary | ICD-10-CM | POA: Diagnosis not present

## 2019-12-20 DIAGNOSIS — Z79899 Other long term (current) drug therapy: Secondary | ICD-10-CM | POA: Diagnosis not present

## 2019-12-20 DIAGNOSIS — Z0000 Encounter for general adult medical examination without abnormal findings: Secondary | ICD-10-CM | POA: Diagnosis not present

## 2019-12-20 DIAGNOSIS — Z1159 Encounter for screening for other viral diseases: Secondary | ICD-10-CM | POA: Diagnosis not present

## 2019-12-20 DIAGNOSIS — E7800 Pure hypercholesterolemia, unspecified: Secondary | ICD-10-CM | POA: Diagnosis not present

## 2019-12-20 DIAGNOSIS — E039 Hypothyroidism, unspecified: Secondary | ICD-10-CM | POA: Diagnosis not present

## 2020-01-29 DIAGNOSIS — Z23 Encounter for immunization: Secondary | ICD-10-CM | POA: Diagnosis not present

## 2020-02-10 DIAGNOSIS — Z6823 Body mass index (BMI) 23.0-23.9, adult: Secondary | ICD-10-CM | POA: Diagnosis not present

## 2020-02-10 DIAGNOSIS — Z01419 Encounter for gynecological examination (general) (routine) without abnormal findings: Secondary | ICD-10-CM | POA: Diagnosis not present

## 2020-02-25 DIAGNOSIS — Z23 Encounter for immunization: Secondary | ICD-10-CM | POA: Diagnosis not present

## 2020-07-31 ENCOUNTER — Other Ambulatory Visit: Payer: Self-pay | Admitting: Obstetrics and Gynecology

## 2020-07-31 DIAGNOSIS — Z1231 Encounter for screening mammogram for malignant neoplasm of breast: Secondary | ICD-10-CM

## 2020-09-16 ENCOUNTER — Other Ambulatory Visit: Payer: Self-pay

## 2020-09-16 ENCOUNTER — Ambulatory Visit
Admission: RE | Admit: 2020-09-16 | Discharge: 2020-09-16 | Disposition: A | Discharge status: Home / Self Care | Payer: Medicare Other | Source: Ambulatory Visit | Attending: Obstetrics and Gynecology | Admitting: Obstetrics and Gynecology

## 2020-09-16 DIAGNOSIS — Z1231 Encounter for screening mammogram for malignant neoplasm of breast: Secondary | ICD-10-CM

## 2021-01-25 IMAGING — MG DIGITAL SCREENING BILAT W/ TOMO W/ CAD
6 of 10 series · 6 of 30 positions shown · non-contrast
Comparison: Previous exam(s).

CLINICAL DATA: Screening.

EXAM:
DIGITAL SCREENING BILATERAL MAMMOGRAM WITH TOMO AND CAD

[L MLO synth-2D (1 of 2)]
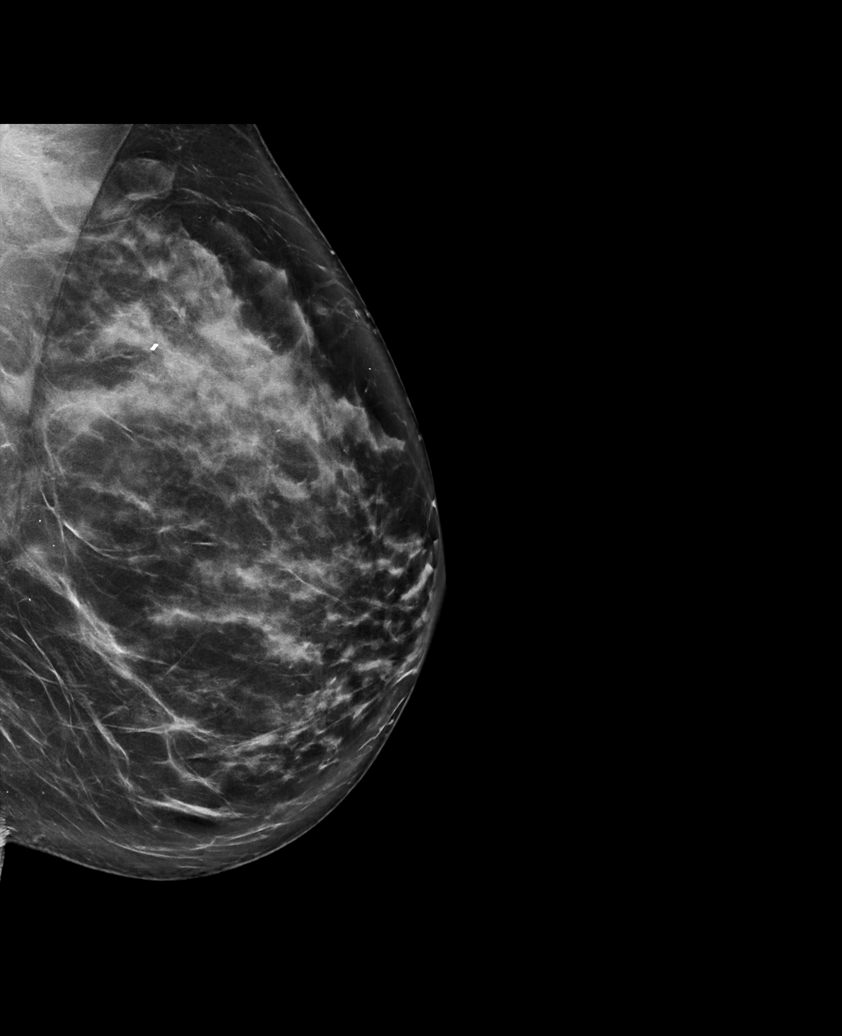

[L CC synth-2D]
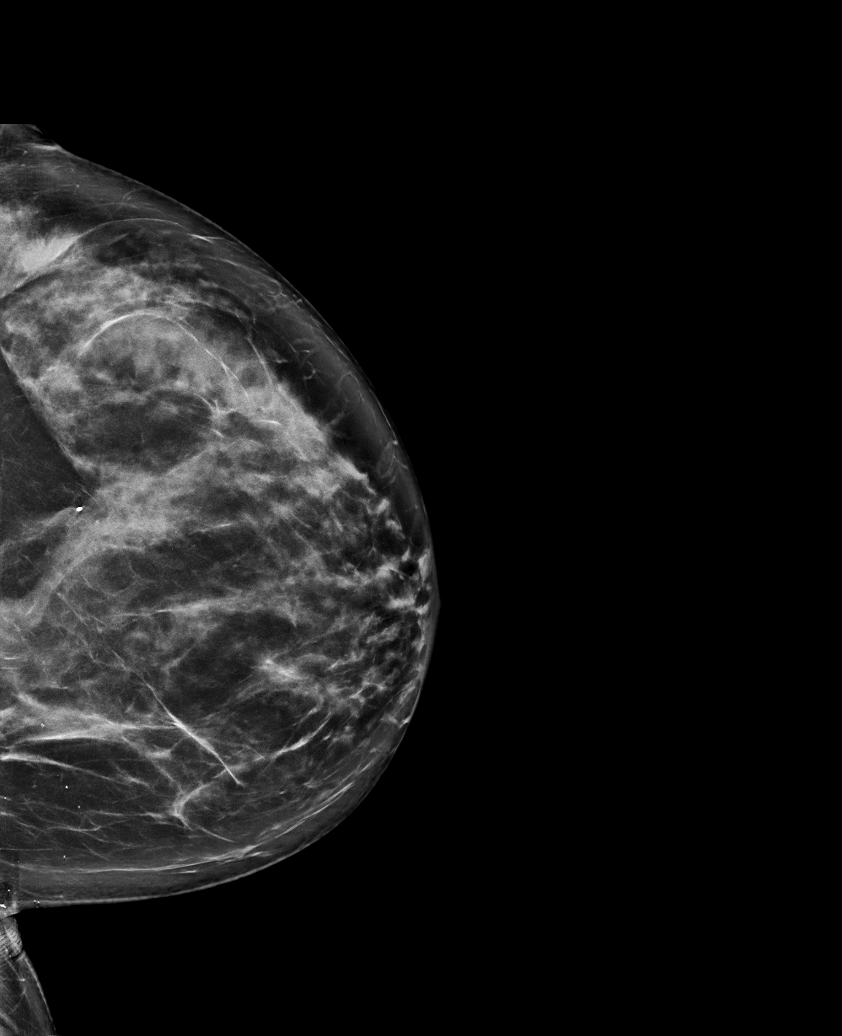

[R MLO synth-2D]
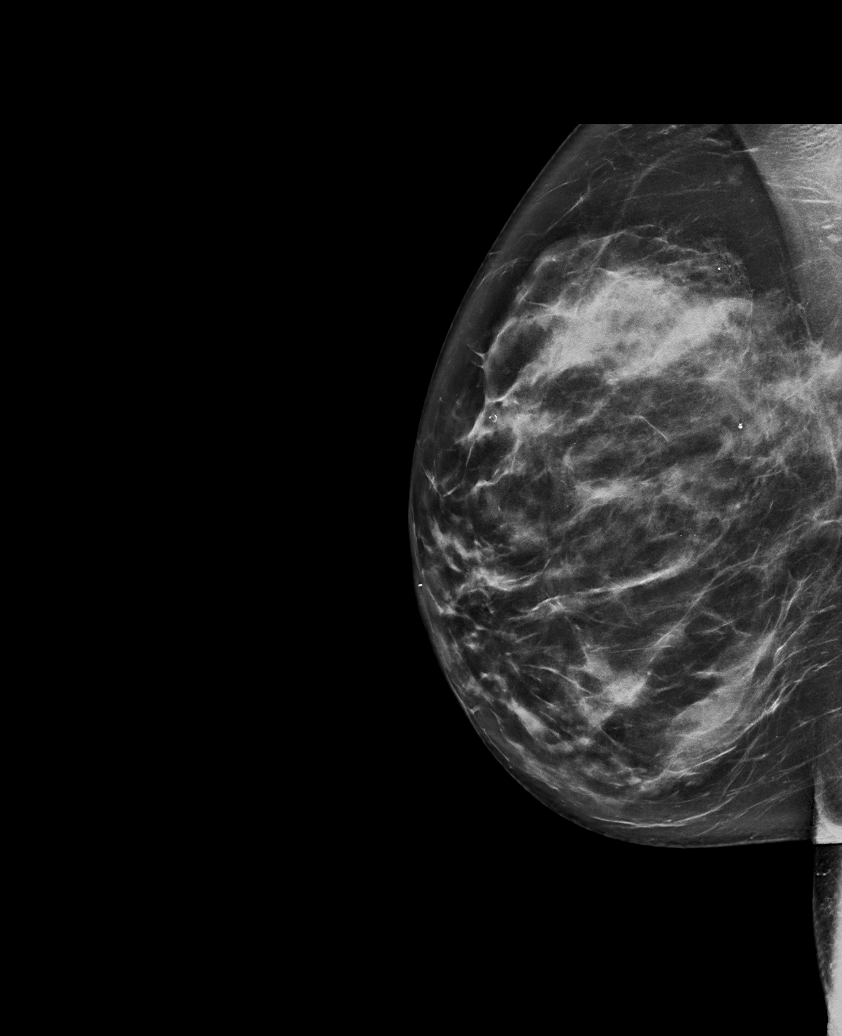

[R CC synth-2D]
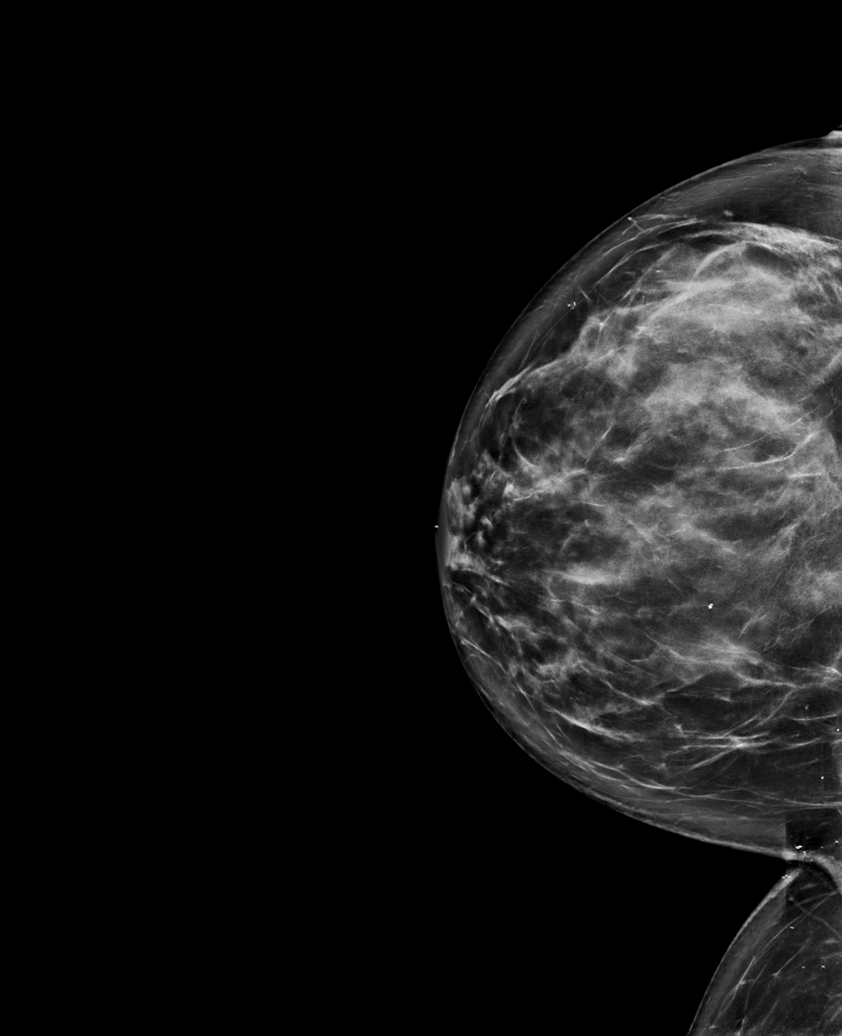

[L MLO synth-2D (2 of 2)]
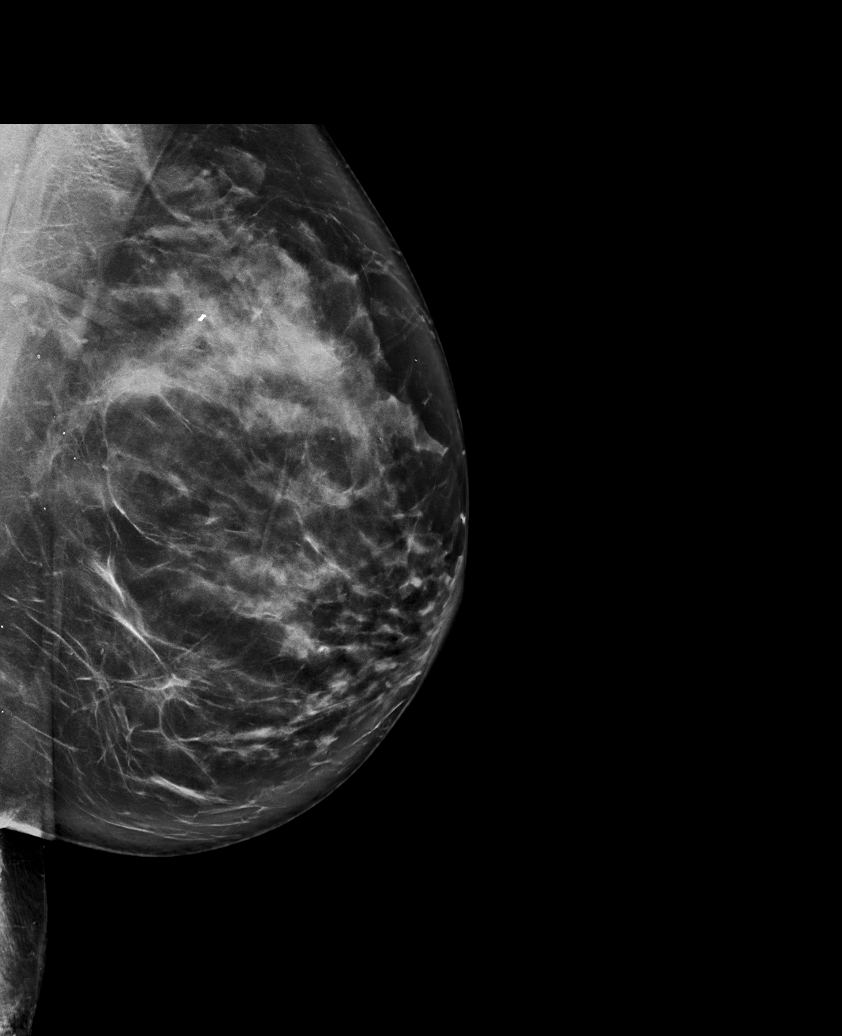

[R MLO tomo · tomo slice 44/87.0]
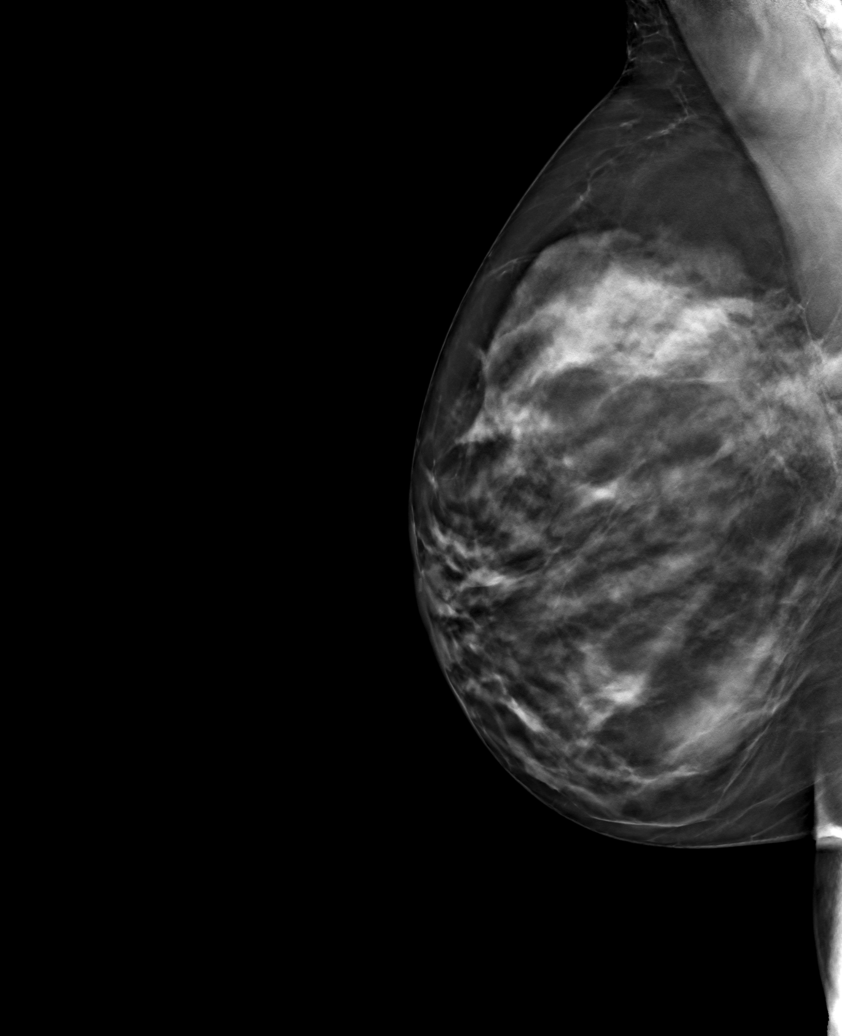

[6 of 30 positions shown; findings below may reference images not displayed]

ACR Breast Density Category c: The breast tissue is heterogeneously
dense, which may obscure small masses.
FINDINGS: There are no findings suspicious for malignancy. Images were
processed with CAD.
IMPRESSION: No mammographic evidence of malignancy. A result letter of this
screening mammogram will be mailed directly to the patient.

RECOMMENDATION:
Screening mammogram in one year. (Code:[id number])

BI-RADS CATEGORY  1: Negative.

## 2021-11-04 ENCOUNTER — Other Ambulatory Visit: Payer: Self-pay | Admitting: Obstetrics and Gynecology

## 2021-11-04 DIAGNOSIS — Z1231 Encounter for screening mammogram for malignant neoplasm of breast: Secondary | ICD-10-CM

## 2021-12-08 ENCOUNTER — Ambulatory Visit
Admission: RE | Admit: 2021-12-08 | Discharge: 2021-12-08 | Disposition: A | Discharge status: Home / Self Care | Payer: Medicare Other | Source: Ambulatory Visit | Attending: Obstetrics and Gynecology | Admitting: Obstetrics and Gynecology

## 2021-12-08 DIAGNOSIS — Z1231 Encounter for screening mammogram for malignant neoplasm of breast: Secondary | ICD-10-CM

## 2022-12-19 ENCOUNTER — Other Ambulatory Visit: Payer: Self-pay | Admitting: Obstetrics and Gynecology

## 2022-12-19 DIAGNOSIS — Z1231 Encounter for screening mammogram for malignant neoplasm of breast: Secondary | ICD-10-CM

## 2023-02-22 ENCOUNTER — Ambulatory Visit
Admission: RE | Admit: 2023-02-22 | Discharge: 2023-02-22 | Disposition: A | Discharge status: Home / Self Care | Payer: Medicare Other | Source: Ambulatory Visit | Attending: Obstetrics and Gynecology | Admitting: Obstetrics and Gynecology

## 2023-02-22 DIAGNOSIS — Z1231 Encounter for screening mammogram for malignant neoplasm of breast: Secondary | ICD-10-CM

## 2023-02-24 ENCOUNTER — Other Ambulatory Visit: Payer: Self-pay | Admitting: Obstetrics and Gynecology

## 2023-02-24 DIAGNOSIS — R928 Other abnormal and inconclusive findings on diagnostic imaging of breast: Secondary | ICD-10-CM

## 2023-02-27 ENCOUNTER — Other Ambulatory Visit: Payer: Self-pay | Admitting: Obstetrics and Gynecology

## 2023-02-27 ENCOUNTER — Ambulatory Visit
Admission: RE | Admit: 2023-02-27 | Discharge: 2023-02-27 | Disposition: A | Discharge status: Home / Self Care | Payer: Medicare Other | Source: Ambulatory Visit | Attending: Obstetrics and Gynecology | Admitting: Obstetrics and Gynecology

## 2023-02-27 DIAGNOSIS — R928 Other abnormal and inconclusive findings on diagnostic imaging of breast: Secondary | ICD-10-CM

## 2023-03-03 ENCOUNTER — Ambulatory Visit
Admission: RE | Admit: 2023-03-03 | Discharge: 2023-03-03 | Disposition: A | Discharge status: Home / Self Care | Payer: Medicare Other | Source: Ambulatory Visit | Attending: Obstetrics and Gynecology | Admitting: Obstetrics and Gynecology

## 2023-03-03 DIAGNOSIS — R928 Other abnormal and inconclusive findings on diagnostic imaging of breast: Secondary | ICD-10-CM

## 2023-03-03 HISTORY — PX: BREAST BIOPSY: SHX20

## 2023-03-09 ENCOUNTER — Ambulatory Visit: Payer: Self-pay | Admitting: Surgery

## 2023-03-09 DIAGNOSIS — C50911 Malignant neoplasm of unspecified site of right female breast: Secondary | ICD-10-CM

## 2023-03-09 NOTE — H&P (View-Only) (Signed)
 Subjective   Chief Complaint: NEW CANCER     History of Present Illness: [name] is a 66 y.o. female who is seen today as an office consultation  for evaluation of NEW CANCER .   This is a 66-year-old female in good health who recently underwent routine screening mammogram.  This revealed an area of possible distortion in the right upper outer quadrant.  She underwent diagnostic mammogram and ultrasound.  There was no sonographic correlate.  The axilla was negative.  The area of distortion persisted in the lateral posterior right breast.  She underwent biopsy that revealed a diagnosis of invasive lobular carcinoma, ER/PR positive, HER2 negative, Ki-67 1%.  She presents now for her initial evaluation.  She is accompanied by her husband who is a retired oncologist.  She plans to have her oncology under the care of Dr. Claire Dees at [facility].  If she decides to have radiation, she will probably have that performed at [place].  No previous breast problems other than a benign biopsy in 2012 in the left breast.  No family history of breast cancer.  No complaints prior to this mammogram.  The patient was on progesterone and estradiol patch but stopped that 2 days ago.    Review of Systems: A complete review of systems was obtained from the patient.  I have reviewed this information and discussed as appropriate with the patient.  See HPI as well for other ROS.  Review of Systems  Constitutional: Negative.   HENT: Negative.    Eyes: Negative.   Respiratory: Negative.    Cardiovascular: Negative.   Gastrointestinal: Negative.   Genitourinary: Negative.   Musculoskeletal: Negative.   Skin: Negative.   Neurological: Negative.   Endo/Heme/Allergies: Negative.       Medical History: Past Medical History:  Diagnosis Date   Hyperlipidemia    Thyroid disease     Patient Active Problem List  Diagnosis   Hypothyroid   Post-menopause on HRT (hormone replacement therapy)   Right  bundle branch block (RBBB) on electrocardiogram (ECG)   Scoliosis    Past Surgical History:  Procedure Laterality Date   CESAREAN SECTION  1991   CHOLECYSTECTOMY       Allergies  Allergen Reactions   Statins-Hmg-Coa Reductase Inhibitors Other (See Comments)    Current Outpatient Medications on File Prior to Visit  Medication Sig Dispense Refill   cetirizine (ZYRTEC) 10 MG tablet Take 10 mg by mouth once daily     estradiol (DOTTI) patch 0.0375 mg/24 hr      levothyroxine (SYNTHROID) 112 MCG tablet Take 112 mcg by mouth every morning before breakfast (0630)     progesterone (PROMETRIUM) 200 MG capsule      No current facility-administered medications on file prior to visit.    Family History  Problem Relation Age of Onset   High blood pressure (Hypertension) Brother      Social History   Tobacco Use  Smoking Status Never  Smokeless Tobacco Never     Social History   Socioeconomic History   Marital status: Married  Tobacco Use   Smoking status: Never   Smokeless tobacco: Never  Substance and Sexual Activity   Alcohol use: Never   Drug use: Never   Social Determinants of Health    Received from [facility]   Social Network    Objective:    Vitals:   03/09/23 1402 03/09/23 1410  BP: (!) 158/90   Pulse: 63   Temp: 36.1 C (97   F)   SpO2: 98%   Weight: 68.9 kg (152 lb)   Height: 167.6 cm (5' 6")   PainSc:  0-No pain  PainLoc:  Breast    Body mass index is 24.53 kg/m.  Physical Exam   Constitutional:  WDWN in NAD, conversant, no obvious deformities; lying in bed comfortably Eyes:  Pupils equal, round; sclera anicteric; moist conjunctiva; no lid lag HENT:  Oral mucosa moist; good dentition  Neck:  No masses palpated, trachea midline; no thyromegaly Breasts:  symmetric, no nipple changes; no palpable masses or lymphadenopathy on either side; slight ecchymosis in the right upper outer quadrant CV:  Regular rate and rhythm; no murmurs; extremities  well-perfused with no edema Abd:  +bowel sounds, soft, non-tender, no palpable organomegaly; no palpable hernias Musc: Normal assess gait; no apparent clubbing or cyanosis in extremities Lymphatic:  No palpable cervical or axillary lymphadenopathy Skin:  Warm, dry; no sign of jaundice Psychiatric - alert and oriented x 4; calm mood and affect   Labs, Imaging and Diagnostic Testing  Diagnosis Breast, right, needle core biopsy - INVASIVE MAMMARY CARCINOMA, SEE NOTE - TUBULE FORMATION: SCORE 3 - NUCLEAR PLEOMORPHISM: SCORE 2 - MITOTIC COUNT: SCORE 1 - TOTAL SCORE: 6 - OVERALL GRADE: 2 - LYMPHOVASCULAR INVASION: NOT IDENTIFIED - CANCER LENGTH: 0.4 CM - CALCIFICATIONS: NOT IDENTIFIED - OTHER FINDINGS: NONE Diagnosis Note Dr. LeGolvan reviewed the case and concurs with the above diagnosis. A breast prognostic profile (ER, PR, Ki-67 and HER2) and immunohistochemical stain for E-cadherin are pending and will be reported in an addendum. [facility] was notified on 03/07/2023. Nilesh Kashikar MD Pathologist, Electronic Signature (Case signed 03/07/2023)  PROGNOSTIC INDICATOR RESULTS: Immunohistochemical and morphometric analysis performed manually The tumor cells are NEGATIVE for Her2 (0). Estrogen Receptor: 90%, POSITIVE, STRONG STAINING INTENSITY Progesterone Receptor: 100%, POSITIVE, STRONG STAINING INTENISTY Proliferation Marker Ki-67: 1% Comment: The negative hormone receptor study(ies) in the case has an internal positive control. Reference Range Estrogen and Progesterone Receptor Negative 0% Positive >1% All controls stained appropriately. JOHN PATRICK MD Pathologist, Electronic Signature ( Signed 03/08/2023) Immunohistochemical stain for E-cadherin is negative in the tumor cells, consistent with a lobular phenotype. Nilesh Kashikar MD Pathologist, Electronic Signature ( Signed 03/09/2023)    CLINICAL DATA:  Screening recall for a possible  right breast distortion.   EXAM: DIGITAL DIAGNOSTIC UNILATERAL RIGHT MAMMOGRAM WITH TOMOSYNTHESIS; ULTRASOUND RIGHT BREAST LIMITED   TECHNIQUE: Right digital diagnostic mammography and breast tomosynthesis was performed.; Targeted ultrasound examination of the right breast was performed   COMPARISON:  Previous exam(s).   ACR Breast Density Category c: The breasts are heterogeneously dense, which may obscure small masses.   FINDINGS: Spot compression tomosynthesis images through the upper outer posterior right breast demonstrates a persistent possible subtle distortion. This also persists on the full paddle rolled lateral CC view, but is not seen on the rolled medial CC, or the lateral views. There is a small group of calcifications just posterior to the questioned distortion which has been stable for several years, and is considered to be benign.   Ultrasound targeted to the upper-outer quadrant of the right breast demonstrates no suspicious masses or areas of shadowing to correspond with the questioned area of distortion. Ultrasound of the right axilla demonstrates multiple normal-appearing lymph nodes.   IMPRESSION: 1. There is a persistent subtle possible area of distortion in the lateral posterior right breast.   2.  No abnormal right axillary lymphadenopathy.   RECOMMENDATION: Stereotactic biopsy is recommended   for the possible right breast distortion. The patient is aware that at times these questioned subtle distortions may not persist at the time of biopsy. If that is the case, a six-month follow-up right breast diagnostic mammogram would be recommended. She has been scheduled for biopsy on 03/03/2023 at 12:45 p.m.   I have discussed the findings and recommendations with the patient. If applicable, a reminder letter will be sent to the patient regarding the next appointment.   BI-RADS CATEGORY  4: Suspicious.     Electronically Signed   By: Michelle   Collins M.D.   On: 02/27/2023 16:11  Assessment and Plan:  Diagnoses and all orders for this visit:  Malignant neoplasm of upper-outer quadrant of right breast in female, estrogen receptor positive (CMS/HHS-HCC)    I had a long discussion with the patient and her husband regarding her diagnosis.  This is a fairly small area of distortion on the mammogram.  Her prognostic panel shows several favorable factors.  We discussed her surgical options.  We discussed mastectomy versus breast conservation.  I did explain to her that lumpectomy is typically followed by radiation therapy but she has not yet decided whether she wants to go through with radiation therapy.  I did encourage her to have a postoperative consultation with radiation oncology so she is aware of all of the information before making her final decision.  We discussed the more recent data regarding sentinel lymph node biopsy and the possibility of avoiding SLNB.  She has a fairly small tumor with favorable prognostics, and at her age it would be reasonable to avoid SLNB at this time.    After long discussion, we will plan a right breast radioactive seed localized lumpectomy.  The surgical procedure has been discussed with the patient.  Potential risks, benefits, alternative treatments, and expected outcomes have been explained.  All of the patient's questions at this time have been answered.  The likelihood of reaching the patient's treatment goal is good.  The patient understand the proposed surgical procedure and wishes to proceed.   Heydi Swango KAI Elyana Grabski, MD  03/09/2023 3:47 PM  

## 2023-03-09 NOTE — H&P (Signed)
Subjective   Chief Complaint: NEW CANCER     History of Present Illness: [name] is a 67 y.o. female who is seen today as an office consultation  for evaluation of NEW CANCER .   This is a 67 year old female in good health who recently underwent routine screening mammogram.  This revealed an area of possible distortion in the right upper outer quadrant.  She underwent diagnostic mammogram and ultrasound.  There was no sonographic correlate.  The axilla was negative.  The area of distortion persisted in the lateral posterior right breast.  She underwent biopsy that revealed a diagnosis of invasive lobular carcinoma, ER/PR positive, HER2 negative, Ki-67 1%.  She presents now for her initial evaluation.  She is accompanied by her husband who is a retired Best boy.  She plans to have her oncology under the care of Dr. Marc Morgans at [facility].  If she decides to have radiation, she will probably have that performed at [facility].  No previous breast problems other than a benign biopsy in 2012 in the left breast.  No family history of breast cancer.  No complaints prior to this mammogram.  The patient was on progesterone and estradiol patch but stopped that 2 days ago.    Review of Systems: A complete review of systems was obtained from the patient.  I have reviewed this information and discussed as appropriate with the patient.  See HPI as well for other ROS.  Review of Systems  Constitutional: Negative.   HENT: Negative.    Eyes: Negative.   Respiratory: Negative.    Cardiovascular: Negative.   Gastrointestinal: Negative.   Genitourinary: Negative.   Musculoskeletal: Negative.   Skin: Negative.   Neurological: Negative.   Endo/Heme/Allergies: Negative.       Medical History: Past Medical History:  Diagnosis Date   Hyperlipidemia    Thyroid disease     Patient Active Problem List  Diagnosis   Hypothyroid   Post-menopause on HRT (hormone replacement therapy)   Right  bundle branch block (RBBB) on electrocardiogram (ECG)   Scoliosis    Past Surgical History:  Procedure Laterality Date   CESAREAN SECTION  1991   CHOLECYSTECTOMY       Allergies  Allergen Reactions   Statins-Hmg-Coa Reductase Inhibitors Other (See Comments)    Current Outpatient Medications on File Prior to Visit  Medication Sig Dispense Refill   cetirizine (ZYRTEC) 10 MG tablet Take 10 mg by mouth once daily     estradiol (DOTTI) patch 0.0375 mg/24 hr      levothyroxine (SYNTHROID) 112 MCG tablet Take 112 mcg by mouth every morning before breakfast (0630)     progesterone (PROMETRIUM) 200 MG capsule      No current facility-administered medications on file prior to visit.    Family History  Problem Relation Age of Onset   High blood pressure (Hypertension) Brother      Social History   Tobacco Use  Smoking Status Never  Smokeless Tobacco Never     Social History   Socioeconomic History   Marital status: Married  Tobacco Use   Smoking status: Never   Smokeless tobacco: Never  Substance and Sexual Activity   Alcohol use: Never   Drug use: Never   Social Determinants of Health    Received from [employer]   Social Network    Objective:    Vitals:   03/09/23 1402 03/09/23 1410  BP: (!) 158/90   Pulse: 63   Temp: 36.1 C (97  F)   SpO2: 98%   Weight: 68.9 kg (152 lb)   Height: 167.6 cm (5\' 6" )   PainSc:  0-No pain  PainLoc:  Breast    Body mass index is 24.53 kg/m.  Physical Exam   Constitutional:  WDWN in NAD, conversant, no obvious deformities; lying in bed comfortably Eyes:  Pupils equal, round; sclera anicteric; moist conjunctiva; no lid lag HENT:  Oral mucosa moist; good dentition  Neck:  No masses palpated, trachea midline; no thyromegaly Breasts:  symmetric, no nipple changes; no palpable masses or lymphadenopathy on either side; slight ecchymosis in the right upper outer quadrant CV:  Regular rate and rhythm; no murmurs; extremities  well-perfused with no edema Abd:  +bowel sounds, soft, non-tender, no palpable organomegaly; no palpable hernias Musc: Normal assess gait; no apparent clubbing or cyanosis in extremities Lymphatic:  No palpable cervical or axillary lymphadenopathy Skin:  Warm, dry; no sign of jaundice Psychiatric - alert and oriented x 4; calm mood and affect   Labs, Imaging and Diagnostic Testing  Diagnosis Breast, right, needle core biopsy - INVASIVE MAMMARY CARCINOMA, SEE NOTE - TUBULE FORMATION: SCORE 3 - NUCLEAR PLEOMORPHISM: SCORE 2 - MITOTIC COUNT: SCORE 1 - TOTAL SCORE: 6 - OVERALL GRADE: 2 - LYMPHOVASCULAR INVASION: NOT IDENTIFIED - CANCER LENGTH: 0.4 CM - CALCIFICATIONS: NOT IDENTIFIED - OTHER FINDINGS: NONE Diagnosis Note Dr. Kenyon Ana reviewed the case and concurs with the above diagnosis. A breast prognostic profile (ER, PR, Ki-67 and HER2) and immunohistochemical stain for E-cadherin are pending and will be reported in an addendum. [facility] was notified on 03/07/2023. Holley Bouche MD Pathologist, Electronic Signature (Case signed 03/07/2023)  PROGNOSTIC INDICATOR RESULTS: Immunohistochemical and morphometric analysis performed manually The tumor cells are NEGATIVE for Her2 (0). Estrogen Receptor: 90%, POSITIVE, STRONG STAINING INTENSITY Progesterone Receptor: 100%, POSITIVE, STRONG STAINING INTENISTY Proliferation Marker Ki-67: 1% Comment: The negative hormone receptor study(ies) in the case has an internal positive control. Reference Range Estrogen and Progesterone Receptor Negative 0% Positive >1% All controls stained appropriately. Jimmy Picket MD Pathologist, Electronic Signature ( Signed 03/08/2023) Immunohistochemical stain for E-cadherin is negative in the tumor cells, consistent with a lobular phenotype. Holley Bouche MD Pathologist, Electronic Signature ( Signed 03/09/2023)    CLINICAL DATA:  Screening recall for a possible  right breast distortion.   EXAM: DIGITAL DIAGNOSTIC UNILATERAL RIGHT MAMMOGRAM WITH TOMOSYNTHESIS; ULTRASOUND RIGHT BREAST LIMITED   TECHNIQUE: Right digital diagnostic mammography and breast tomosynthesis was performed.; Targeted ultrasound examination of the right breast was performed   COMPARISON:  Previous exam(s).   ACR Breast Density Category c: The breasts are heterogeneously dense, which may obscure small masses.   FINDINGS: Spot compression tomosynthesis images through the upper outer posterior right breast demonstrates a persistent possible subtle distortion. This also persists on the full paddle rolled lateral CC view, but is not seen on the rolled medial CC, or the lateral views. There is a small group of calcifications just posterior to the questioned distortion which has been stable for several years, and is considered to be benign.   Ultrasound targeted to the upper-outer quadrant of the right breast demonstrates no suspicious masses or areas of shadowing to correspond with the questioned area of distortion. Ultrasound of the right axilla demonstrates multiple normal-appearing lymph nodes.   IMPRESSION: 1. There is a persistent subtle possible area of distortion in the lateral posterior right breast.   2.  No abnormal right axillary lymphadenopathy.   RECOMMENDATION: Stereotactic biopsy is recommended  for the possible right breast distortion. The patient is aware that at times these questioned subtle distortions may not persist at the time of biopsy. If that is the case, a six-month follow-up right breast diagnostic mammogram would be recommended. She has been scheduled for biopsy on 03/03/2023 at 12:45 p.m.   I have discussed the findings and recommendations with the patient. If applicable, a reminder letter will be sent to the patient regarding the next appointment.   BI-RADS CATEGORY  4: Suspicious.     Electronically Signed   By: Frederico Hamman M.D.   On: 02/27/2023 16:11  Assessment and Plan:  Diagnoses and all orders for this visit:  Malignant neoplasm of upper-outer quadrant of right breast in female, estrogen receptor positive (CMS/HHS-HCC)    I had a long discussion with the patient and her husband regarding her diagnosis.  This is a fairly small area of distortion on the mammogram.  Her prognostic panel shows several favorable factors.  We discussed her surgical options.  We discussed mastectomy versus breast conservation.  I did explain to her that lumpectomy is typically followed by radiation therapy but she has not yet decided whether she wants to go through with radiation therapy.  I did encourage her to have a postoperative consultation with radiation oncology so she is aware of all of the information before making her final decision.  We discussed the more recent data regarding sentinel lymph node biopsy and the possibility of avoiding SLNB.  She has a fairly small tumor with favorable prognostics, and at her age it would be reasonable to avoid SLNB at this time.    After long discussion, we will plan a right breast radioactive seed localized lumpectomy.  The surgical procedure has been discussed with the patient.  Potential risks, benefits, alternative treatments, and expected outcomes have been explained.  All of the patient's questions at this time have been answered.  The likelihood of reaching the patient's treatment goal is good.  The patient understand the proposed surgical procedure and wishes to proceed.   Lissa Morales, MD  03/09/2023 3:47 PM

## 2023-03-14 ENCOUNTER — Other Ambulatory Visit: Payer: Self-pay | Admitting: Surgery

## 2023-03-14 ENCOUNTER — Other Ambulatory Visit: Payer: Self-pay

## 2023-03-14 ENCOUNTER — Encounter (HOSPITAL_BASED_OUTPATIENT_CLINIC_OR_DEPARTMENT_OTHER): Payer: Self-pay | Admitting: Surgery

## 2023-03-14 DIAGNOSIS — C50911 Malignant neoplasm of unspecified site of right female breast: Secondary | ICD-10-CM

## 2023-03-20 ENCOUNTER — Ambulatory Visit
Admission: RE | Admit: 2023-03-20 | Discharge: 2023-03-20 | Disposition: A | Discharge status: Home / Self Care | Payer: Medicare Other | Source: Ambulatory Visit | Attending: Surgery | Admitting: Surgery

## 2023-03-20 ENCOUNTER — Other Ambulatory Visit: Payer: Self-pay

## 2023-03-20 ENCOUNTER — Encounter (HOSPITAL_BASED_OUTPATIENT_CLINIC_OR_DEPARTMENT_OTHER)
Admission: RE | Disposition: A | Discharge status: Home / Self Care | Payer: Self-pay | Source: Home / Self Care | Attending: Surgery

## 2023-03-20 ENCOUNTER — Ambulatory Visit (HOSPITAL_BASED_OUTPATIENT_CLINIC_OR_DEPARTMENT_OTHER)
Admission: RE | Admit: 2023-03-20 | Discharge: 2023-03-20 | Disposition: A | Discharge status: Home / Self Care | Payer: Medicare Other | Attending: Surgery | Admitting: Surgery

## 2023-03-20 ENCOUNTER — Ambulatory Visit (HOSPITAL_BASED_OUTPATIENT_CLINIC_OR_DEPARTMENT_OTHER): Payer: Medicare Other | Admitting: Certified Registered"

## 2023-03-20 DIAGNOSIS — E039 Hypothyroidism, unspecified: Secondary | ICD-10-CM | POA: Insufficient documentation

## 2023-03-20 DIAGNOSIS — C50911 Malignant neoplasm of unspecified site of right female breast: Secondary | ICD-10-CM

## 2023-03-20 DIAGNOSIS — Z170 Estrogen receptor positive status [ER+]: Secondary | ICD-10-CM | POA: Insufficient documentation

## 2023-03-20 DIAGNOSIS — C50411 Malignant neoplasm of upper-outer quadrant of right female breast: Secondary | ICD-10-CM | POA: Diagnosis present

## 2023-03-20 HISTORY — DX: Hypothyroidism, unspecified: E03.9

## 2023-03-20 HISTORY — PX: BREAST LUMPECTOMY WITH RADIOACTIVE SEED LOCALIZATION: SHX6424

## 2023-03-20 HISTORY — PX: BREAST BIOPSY: SHX20

## 2023-03-20 SURGERY — BREAST LUMPECTOMY WITH RADIOACTIVE SEED LOCALIZATION
Anesthesia: General | Site: Breast | Laterality: Right

## 2023-03-20 MED ORDER — ONDANSETRON HCL 4 MG/2ML IJ SOLN
INTRAMUSCULAR | Status: DC | PRN
  Administered 2023-03-20: 4 mg via INTRAVENOUS

## 2023-03-20 MED ORDER — LACTATED RINGERS IV SOLN: INTRAVENOUS | Status: DC | PRN

## 2023-03-20 MED ORDER — ONDANSETRON HCL 4 MG/2ML IJ SOLN: 4.0000 mg | Freq: Once | INTRAMUSCULAR | Status: DC | PRN

## 2023-03-20 MED ORDER — CEFAZOLIN SODIUM-DEXTROSE 2-4 GM/100ML-% IV SOLN
INTRAVENOUS | Status: AC
  Filled 2023-03-20: qty 100

## 2023-03-20 MED ORDER — ACETAMINOPHEN 500 MG PO TABS
1000.0000 mg | ORAL_TABLET | Freq: Once | ORAL | Status: AC
  Administered 2023-03-20: 1000 mg via ORAL

## 2023-03-20 MED ORDER — ACETAMINOPHEN 500 MG PO TABS
ORAL_TABLET | ORAL | Status: AC
  Filled 2023-03-20: qty 2

## 2023-03-20 MED ORDER — CHLORHEXIDINE GLUCONATE CLOTH 2 % EX PADS: 6.0000 | MEDICATED_PAD | Freq: Once | CUTANEOUS | Status: DC

## 2023-03-20 MED ORDER — FENTANYL CITRATE (PF) 100 MCG/2ML IJ SOLN
INTRAMUSCULAR | Status: AC
  Filled 2023-03-20: qty 2

## 2023-03-20 MED ORDER — ACETAMINOPHEN 500 MG PO TABS: 1000.0000 mg | ORAL_TABLET | ORAL | Status: DC

## 2023-03-20 MED ORDER — HYDROMORPHONE HCL 1 MG/ML IJ SOLN: 0.2500 mg | INTRAMUSCULAR | Status: DC | PRN

## 2023-03-20 MED ORDER — MIDAZOLAM HCL 2 MG/2ML IJ SOLN
INTRAMUSCULAR | Status: AC
  Filled 2023-03-20: qty 2

## 2023-03-20 MED ORDER — BUPIVACAINE-EPINEPHRINE 0.25% -1:200000 IJ SOLN
INTRAMUSCULAR | Status: DC | PRN
  Administered 2023-03-20: 20 mL

## 2023-03-20 MED ORDER — ONDANSETRON HCL 4 MG/2ML IJ SOLN
INTRAMUSCULAR | Status: AC
  Filled 2023-03-20: qty 2

## 2023-03-20 MED ORDER — LIDOCAINE 2% (20 MG/ML) 5 ML SYRINGE
INTRAMUSCULAR | Status: AC
  Filled 2023-03-20: qty 5

## 2023-03-20 MED ORDER — LACTATED RINGERS IV SOLN: INTRAVENOUS | Status: DC

## 2023-03-20 MED ORDER — CEFAZOLIN SODIUM-DEXTROSE 2-3 GM-%(50ML) IV SOLR
INTRAVENOUS | Status: DC | PRN
  Administered 2023-03-20: 2 g via INTRAVENOUS

## 2023-03-20 MED ORDER — DEXAMETHASONE SODIUM PHOSPHATE 10 MG/ML IJ SOLN
INTRAMUSCULAR | Status: DC | PRN
  Administered 2023-03-20: 10 mg via INTRAVENOUS

## 2023-03-20 MED ORDER — FENTANYL CITRATE (PF) 100 MCG/2ML IJ SOLN
INTRAMUSCULAR | Status: DC | PRN
  Administered 2023-03-20: 100 ug via INTRAVENOUS

## 2023-03-20 MED ORDER — PROPOFOL 10 MG/ML IV BOLUS
INTRAVENOUS | Status: AC
  Filled 2023-03-20: qty 20

## 2023-03-20 MED ORDER — PROPOFOL 500 MG/50ML IV EMUL
INTRAVENOUS | Status: DC | PRN
  Administered 2023-03-20: 150 ug/kg/min via INTRAVENOUS

## 2023-03-20 MED ORDER — MIDAZOLAM HCL 2 MG/2ML IJ SOLN
INTRAMUSCULAR | Status: DC | PRN
  Administered 2023-03-20: 2 mg via INTRAVENOUS

## 2023-03-20 MED ORDER — OXYCODONE HCL 5 MG PO TABS: 5.0000 mg | ORAL_TABLET | Freq: Once | ORAL | Status: DC | PRN

## 2023-03-20 MED ORDER — PHENYLEPHRINE HCL (PRESSORS) 10 MG/ML IV SOLN
INTRAVENOUS | Status: DC | PRN
  Administered 2023-03-20 (×2): 80 ug via INTRAVENOUS

## 2023-03-20 MED ORDER — PROPOFOL 10 MG/ML IV BOLUS
INTRAVENOUS | Status: DC | PRN
  Administered 2023-03-20: 200 mg via INTRAVENOUS

## 2023-03-20 MED ORDER — OXYCODONE HCL 5 MG/5ML PO SOLN: 5.0000 mg | Freq: Once | ORAL | Status: DC | PRN

## 2023-03-20 MED ORDER — DEXAMETHASONE SODIUM PHOSPHATE 10 MG/ML IJ SOLN
INTRAMUSCULAR | Status: AC
  Filled 2023-03-20: qty 1

## 2023-03-20 MED ORDER — CEFAZOLIN SODIUM-DEXTROSE 2-4 GM/100ML-% IV SOLN: 2.0000 g | INTRAVENOUS | Status: DC

## 2023-03-20 MED ORDER — AMISULPRIDE (ANTIEMETIC) 5 MG/2ML IV SOLN: 10.0000 mg | Freq: Once | INTRAVENOUS | Status: DC | PRN

## 2023-03-20 MED ORDER — LIDOCAINE HCL (CARDIAC) PF 100 MG/5ML IV SOSY
PREFILLED_SYRINGE | INTRAVENOUS | Status: DC | PRN
  Administered 2023-03-20: 60 mg via INTRAVENOUS

## 2023-03-20 SURGICAL SUPPLY — 47 items
APL PRP STRL LF DISP 70% ISPRP (MISCELLANEOUS) ×1
APL SKNCLS STERI-STRIP NONHPOA (GAUZE/BANDAGES/DRESSINGS) ×1
APPLIER CLIP 9.375 MED OPEN (MISCELLANEOUS) ×1
APR CLP MED 9.3 20 MLT OPN (MISCELLANEOUS) ×1
BENZOIN TINCTURE PRP APPL 2/3 (GAUZE/BANDAGES/DRESSINGS) ×2 IMPLANT
BLADE HEX COATED 2.75 (ELECTRODE) ×2 IMPLANT
BLADE SURG 15 STRL LF DISP TIS (BLADE) ×2 IMPLANT
BLADE SURG 15 STRL SS (BLADE) ×1
CANISTER SUC SOCK COL 7IN (MISCELLANEOUS) IMPLANT
CANISTER SUCT 1200ML W/VALVE (MISCELLANEOUS) IMPLANT
CHLORAPREP W/TINT 26 (MISCELLANEOUS) ×2 IMPLANT
CLIP APPLIE 9.375 MED OPEN (MISCELLANEOUS) ×2 IMPLANT
COVER BACK TABLE 60X90IN (DRAPES) ×2 IMPLANT
COVER MAYO STAND STRL (DRAPES) ×2 IMPLANT
COVER PROBE CYLINDRICAL 5X96 (MISCELLANEOUS) ×2 IMPLANT
DRAPE LAPAROTOMY 100X72 PEDS (DRAPES) ×2 IMPLANT
DRAPE UTILITY XL STRL (DRAPES) ×2 IMPLANT
DRSG TEGADERM 4X4.75 (GAUZE/BANDAGES/DRESSINGS) ×2 IMPLANT
ELECT REM PT RETURN 9FT ADLT (ELECTROSURGICAL) ×1
ELECTRODE REM PT RTRN 9FT ADLT (ELECTROSURGICAL) ×2 IMPLANT
GAUZE SPONGE 2X2 STRL 8-PLY (GAUZE/BANDAGES/DRESSINGS) IMPLANT
GAUZE SPONGE 4X4 12PLY STRL LF (GAUZE/BANDAGES/DRESSINGS) IMPLANT
GLOVE BIO SURGEON STRL SZ7 (GLOVE) ×2 IMPLANT
GLOVE BIOGEL PI IND STRL 7.5 (GLOVE) ×2 IMPLANT
GOWN STRL REUS W/ TWL LRG LVL3 (GOWN DISPOSABLE) ×4 IMPLANT
GOWN STRL REUS W/TWL LRG LVL3 (GOWN DISPOSABLE) ×2
KIT MARKER MARGIN INK (KITS) ×2 IMPLANT
NDL HYPO 25X1 1.5 SAFETY (NEEDLE) ×2 IMPLANT
NEEDLE HYPO 25X1 1.5 SAFETY (NEEDLE) ×1 IMPLANT
NS IRRIG 1000ML POUR BTL (IV SOLUTION) ×2 IMPLANT
PACK BASIN DAY SURGERY FS (CUSTOM PROCEDURE TRAY) ×2 IMPLANT
PENCIL SMOKE EVACUATOR (MISCELLANEOUS) ×2 IMPLANT
SLEEVE SCD COMPRESS KNEE MED (STOCKING) ×2 IMPLANT
SPIKE FLUID TRANSFER (MISCELLANEOUS) IMPLANT
SPONGE T-LAP 18X18 ~~LOC~~+RFID (SPONGE) IMPLANT
SPONGE T-LAP 4X18 ~~LOC~~+RFID (SPONGE) ×2 IMPLANT
STRIP CLOSURE SKIN 1/2X4 (GAUZE/BANDAGES/DRESSINGS) ×2 IMPLANT
SUT MON AB 4-0 PC3 18 (SUTURE) ×2 IMPLANT
SUT SILK 2 0 SH (SUTURE) IMPLANT
SUT VIC AB 3-0 SH 27 (SUTURE) ×1
SUT VIC AB 3-0 SH 27X BRD (SUTURE) ×2 IMPLANT
SYR BULB EAR ULCER 3OZ GRN STR (SYRINGE) IMPLANT
SYR CONTROL 10ML LL (SYRINGE) ×2 IMPLANT
TOWEL GREEN STERILE FF (TOWEL DISPOSABLE) ×2 IMPLANT
TRAY FAXITRON CT DISP (TRAY / TRAY PROCEDURE) ×2 IMPLANT
TUBE CONNECTING 20X1/4 (TUBING) IMPLANT
YANKAUER SUCT BULB TIP NO VENT (SUCTIONS) IMPLANT

## 2023-03-20 NOTE — Anesthesia Procedure Notes (Signed)
Procedure Name: LMA Insertion Date/Time: 03/20/2023 11:54 AM  Performed by: Karen Kitchens, CRNAPre-anesthesia Checklist: Patient identified, Emergency Drugs available, Suction available and Patient being monitored Patient Re-evaluated:Patient Re-evaluated prior to induction Oxygen Delivery Method: Circle system utilized Preoxygenation: Pre-oxygenation with 100% oxygen Induction Type: IV induction Ventilation: Mask ventilation without difficulty LMA: LMA inserted LMA Size: 3.0 Number of attempts: 1 Airway Equipment and Method: Bite block Placement Confirmation: positive ETCO2, CO2 detector and breath sounds checked- equal and bilateral Tube secured with: Tape Dental Injury: Teeth and Oropharynx as per pre-operative assessment

## 2023-03-20 NOTE — Interval H&P Note (Signed)
History and Physical Interval Note:  03/20/2023 11:14 AM  [name]  has presented today for surgery, with the diagnosis of RIGHT BREAST INVASIVE DUCTAL CARCINOMA.  The various methods of treatment have been discussed with the patient and family. After consideration of risks, benefits and other options for treatment, the patient has consented to  Procedure(s): RIGHT BREAST LUMPECTOMY WITH RADIOACTIVE SEED LOCALIZATION (Right) as a surgical intervention.  The patient's history has been reviewed, patient examined, no change in status, stable for surgery.  I have reviewed the patient's chart and labs.  Questions were answered to the patient's satisfaction.     Wynona Luna

## 2023-03-20 NOTE — Op Note (Signed)
Pre-op Diagnosis:  Invasive lobular carcinoma - right breast Post-op Diagnosis: same Procedure:  Right breast radioactive seed localized lumpectomy Surgeon:  Leslie Langille K. Anesthesia:  GEN - LMA Indications:  This is a 67 year old female in good health who recently underwent routine screening mammogram.  This revealed an area of possible distortion in the right upper outer quadrant.  She underwent diagnostic mammogram and ultrasound.  There was no sonographic correlate.  The axilla was negative.  The area of distortion persisted in the lateral posterior right breast.  She underwent biopsy that revealed a diagnosis of invasive lobular carcinoma, ER/PR positive, HER2 negative, Ki-67 1%.  She presents now for her initial evaluation.  She is accompanied by her husband who is a retired Best boy.  She plans to have her oncology under the care of Dr. Marc Morgans at [facility].  If she decides to have radiation, she will probably have that performed at [facility].   No previous breast problems other than a benign biopsy in 2012 in the left breast.  No family history of breast cancer.  No complaints prior to this mammogram.   The patient was on progesterone and estradiol patch but stopped that 2 days ago.  After discussion, we will proceed with lumpectomy under radioactive seed localization without sentinel lymph node biopsy.  Oncotype will be sent on the lumpectomy specimen.  Description of procedure: The patient is brought to the operating room placed in supine position on the operating room table. After an adequate level of general anesthesia was obtained, her right breast was prepped with ChloraPrep and draped in sterile fashion. A timeout was taken to ensure the proper patient and proper procedure. We interrogated the breast with the neoprobe. The radioactivity is located lateral to the nipple in the deep posterior breast tissue.  We made a transverse incision lateral to the nipple after infiltrating with 0.25%  Marcaine. Dissection was carried down in the breast tissue with cautery. There is some thickening superiorly, which corresponds to the hematoma from the needle biopsy.  We used the neoprobe to guide [place] towards the radioactive seed. We excised an area of tissue around the radioactive seed 2.5 cm in diameter.  We took our lumpectomy all the way to the underlying pectoralis muscle, including the fascia with the specimen. The specimen was removed and was oriented with a paint kit. Specimen mammogram showed the radioactive seed as well as the biopsy clip within the specimen. This was sent for pathologic examination. There is no residual radioactivity within the biopsy cavity. We inspected carefully for hemostasis. The wound was thoroughly irrigated. Local anesthetic was instilled into the pectoralis muscle.  The wound was closed with a deep layer of 3-0 Vicryl and a subcuticular layer of 4-0 Monocryl. Benzoin Steri-Strips were applied. The patient was then extubated and brought to the recovery room in stable condition. All sponge, instrument, and needle counts are correct.  Wilmon Arms. Corliss Skains, MD, [facility]  General/ Trauma Surgery  03/20/2023 12:26 PM

## 2023-03-20 NOTE — Anesthesia Preprocedure Evaluation (Signed)
Anesthesia Evaluation  Patient identified by MRN, date of birth, ID band Patient awake    Reviewed: Allergy & Precautions, NPO status , Patient's Chart, lab work & pertinent test results  Airway Mallampati: I  TM Distance: >3 FB Neck ROM: Full    Dental  (+) Teeth Intact, Dental Advisory Given   Pulmonary neg pulmonary ROS   Pulmonary exam normal breath sounds clear to auscultation       Cardiovascular negative cardio ROS Normal cardiovascular exam Rhythm:Regular Rate:Normal     Neuro/Psych negative neurological ROS  negative psych ROS   GI/Hepatic negative GI ROS, Neg liver ROS,,,  Endo/Other  Hypothyroidism    Renal/GU negative Renal ROS  negative genitourinary   Musculoskeletal negative musculoskeletal ROS (+)    Abdominal   Peds  Hematology negative hematology ROS (+)   Anesthesia Other Findings R breast DCIS  Reproductive/Obstetrics negative OB ROS                             Anesthesia Physical Anesthesia Plan  ASA: 2  Anesthesia Plan: General   Post-op Pain Management: Tylenol PO (pre-op)*   Induction: Intravenous  PONV Risk Score and Plan: 3 and Ondansetron, Dexamethasone, Midazolam and Treatment may vary due to age or medical condition  Airway Management Planned: LMA  Additional Equipment: None  Intra-op Plan:   Post-operative Plan: Extubation in OR  Informed Consent: I have reviewed the patients History and Physical, chart, labs and discussed the procedure including the risks, benefits and alternatives for the proposed anesthesia with the patient or authorized representative who has indicated his/her understanding and acceptance.     Dental advisory given  Plan Discussed with: CRNA  Anesthesia Plan Comments:        Anesthesia Quick Evaluation

## 2023-03-20 NOTE — Discharge Instructions (Addendum)
Post Anesthesia Home Care Instructions  Activity: Get plenty of rest for the remainder of the day. A responsible individual must stay with you for 24 hours following the procedure.  For the next 24 hours, DO NOT: -Drive a car -Advertising copywriter -Drink alcoholic beverages -Take any medication unless instructed by your physician -Make any legal decisions or sign important papers.  Meals: Start with liquid foods such as gelatin or soup. Progress to regular foods as tolerated. Avoid greasy, spicy, heavy foods. If nausea and/or vomiting occur, drink only clear liquids until the nausea and/or vomiting subsides. Call your physician if vomiting continues.  Special Instructions/Symptoms: Your throat may feel dry or sore from the anesthesia or the breathing tube placed in your throat during surgery. If this causes discomfort, gargle with warm salt water. The discomfort should disappear within 24 hours.  If you had a scopolamine patch placed behind your ear for the management of post- operative nausea and/or vomiting:  1. The medication in the patch is effective for 72 hours, after which it should be removed.  Wrap patch in a tissue and discard in the trash. Wash hands thoroughly with soap and water. 2. You may remove the patch earlier than 72 hours if you experience unpleasant side effects which may include dry mouth, dizziness or visual disturbances. 3. Avoid touching the patch. Wash your hands with soap and water after contact with the patch.     Next doseof tylenol may be taken at [facility] Phone Number [phone]  BREAST BIOPSY/ PARTIAL MASTECTOMY: POST OP INSTRUCTIONS  Always review your discharge instruction sheet given to you by the facility where your surgery was performed.  IF YOU HAVE DISABILITY OR FAMILY LEAVE FORMS, YOU MUST BRING THEM TO THE OFFICE FOR PROCESSING.  DO NOT GIVE THEM TO YOUR DOCTOR.  A prescription for pain medication may be  given to you upon discharge.  Take your pain medication as prescribed, if needed.  If narcotic pain medicine is not needed, then you may take acetaminophen (Tylenol) or ibuprofen (Advil) as needed. Take your usually prescribed medications unless otherwise directed If you need a refill on your pain medication, please contact your pharmacy.  They will contact our office to request authorization.  Prescriptions will not be filled after 5pm or on week-ends. You should eat very light the first 24 hours after surgery, such as soup, crackers, pudding, etc.  Resume your normal diet the day after surgery. Most patients will experience some swelling and bruising in the breast.  Ice packs and a good support bra will help.  Swelling and bruising can take several days to resolve.  It is common to experience some constipation if taking pain medication after surgery.  Increasing fluid intake and taking a stool softener will usually help or prevent this problem from occurring.  A mild laxative (Milk of Magnesia or Miralax) should be taken according to package directions if there are no bowel movements after 48 hours. Unless discharge instructions indicate otherwise, you may remove your bandages 24-48 hours after surgery, and you may shower at that time.  You may have steri-strips (small skin tapes) in place directly over the incision.  These strips should be left on the skin for 7-10 days.  If your surgeon used skin glue on the incision, you may shower in 24 hours.  The glue will flake off over the next 2-3 weeks.  Any sutures or staples will be removed at the office during your follow-up visit.  ACTIVITIES:  You may resume regular daily activities (gradually increasing) beginning the next day.  Wearing a good support bra or sports bra minimizes pain and swelling.  You may have sexual intercourse when it is comfortable. You may drive when you no longer are taking prescription pain medication, you can comfortably wear a  seatbelt, and you can safely maneuver your car and apply brakes. RETURN TO WORK:  ______________________________________________________________________________________ [name] should see your doctor in the office for a follow-up appointment approximately two weeks after your surgery.  Your doctor's nurse will typically make your follow-up appointment when she calls you with your pathology report.  Expect your pathology report 2-3 business days after your surgery.  You may call to check if you do not hear from [place] after three days. OTHER INSTRUCTIONS: _______________________________________________________________________________________________ _____________________________________________________________________________________________________________________________________ _____________________________________________________________________________________________________________________________________ _____________________________________________________________________________________________________________________________________  WHEN TO CALL YOUR DOCTOR: Fever over 101.0 Nausea and/or vomiting. Extreme swelling or bruising. Continued bleeding from incision. Increased pain, redness, or drainage from the incision.  The clinic staff is available to answer your questions during regular business hours.  Please don't hesitate to call and ask to speak to one of the nurses for clinical concerns.  If you have a medical emergency, go to the nearest emergency room or call 911.  A surgeon from [facility] is always on call at the hospital.  For further questions, please visit [email or web address]

## 2023-03-21 ENCOUNTER — Other Ambulatory Visit: Payer: Self-pay

## 2023-03-21 ENCOUNTER — Encounter (HOSPITAL_BASED_OUTPATIENT_CLINIC_OR_DEPARTMENT_OTHER): Payer: Self-pay | Admitting: Surgery

## 2023-03-21 NOTE — Transfer of Care (Signed)
Immediate Anesthesia Transfer of Care Note  Patient: [name]  Procedure(s) Performed: RIGHT BREAST LUMPECTOMY WITH RADIOACTIVE SEED LOCALIZATION (Right: Breast)  Patient Location: PACU  Anesthesia Type:General  Level of Consciousness: awake, alert , and oriented  Airway & Oxygen Therapy: Patient Spontanous Breathing and Patient connected to face mask oxygen  Post-op Assessment: Report given to RN and Post -op Vital signs reviewed and stable  Post vital signs: Reviewed and stable  Last Vitals:  Vitals Value Taken Time  BP 136/80 03/20/23 1330  Temp 36.2 C 03/20/23 1330  Pulse 62 03/20/23 1319  Resp 15 03/20/23 1330  SpO2 98 % 03/20/23 1330  Vitals shown include unvalidated device data.  Last Pain:  Vitals:   03/20/23 1330  TempSrc:   PainSc: 4       Patients Stated Pain Goal: 3 (03/20/23 1330)  Complications: No notable events documented.

## 2023-03-21 NOTE — Anesthesia Postprocedure Evaluation (Signed)
Anesthesia Post Note  Patient: [name]  Procedure(s) Performed: RIGHT BREAST LUMPECTOMY WITH RADIOACTIVE SEED LOCALIZATION (Right: Breast)     Patient location during evaluation: PACU Anesthesia Type: General Level of consciousness: awake and alert, oriented and patient cooperative Pain management: pain level controlled Vital Signs Assessment: post-procedure vital signs reviewed and stable Respiratory status: spontaneous breathing, nonlabored ventilation and respiratory function stable Cardiovascular status: blood pressure returned to baseline and stable Postop Assessment: no apparent nausea or vomiting Anesthetic complications: no   No notable events documented.  Last Vitals:  Vitals:   03/20/23 1315 03/20/23 1330  BP: 130/76 136/80  Pulse: (!) 58   Resp: (!) 9 15  Temp:  (!) 36.2 C  SpO2: 97% 98%    Last Pain:  Vitals:   03/21/23 0952  TempSrc:   PainSc: 1    Pain Goal: Patients Stated Pain Goal: 3 (03/20/23 1330)                 Lannie Fields

## 2023-03-22 LAB — SURGICAL PATHOLOGY
